# Patient Record
Sex: Male | Born: 1965 | Race: White | Hispanic: No | Marital: Married | Smoking: Never smoker
Health system: Southern US, Community
[De-identification: ages and names within clinical notes are randomized; demographics above are authoritative.]

## PROBLEM LIST (undated history)

## (undated) DIAGNOSIS — G4730 Sleep apnea, unspecified: Secondary | ICD-10-CM

## (undated) HISTORY — PX: KNEE SURGERY: SHX244

## (undated) HISTORY — PX: ADENOIDECTOMY: SUR15

## (undated) HISTORY — DX: Sleep apnea, unspecified: G47.30

---

## 1998-02-20 ENCOUNTER — Other Ambulatory Visit: Admission: RE | Admit: 1998-02-20 | Discharge: 1998-02-20 | Payer: Self-pay | Admitting: Obstetrics and Gynecology

## 2007-04-30 ENCOUNTER — Ambulatory Visit (HOSPITAL_BASED_OUTPATIENT_CLINIC_OR_DEPARTMENT_OTHER): Admission: RE | Admit: 2007-04-30 | Discharge: 2007-04-30 | Payer: Self-pay | Admitting: Family Medicine

## 2007-05-07 ENCOUNTER — Ambulatory Visit: Payer: Self-pay | Admitting: Internal Medicine

## 2010-12-11 NOTE — Procedures (Signed)
NAME:  Gregg Lloyd, Gregg Lloyd                 ACCOUNT NO.:  [id number]   MEDICAL RECORD NO.:  [id number]          PATIENT TYPE:  OUT   LOCATION:  SLEEP CENTER                 FACILITY:  [facility]   PHYSICIAN:  Clinton D. Maple Hudson, MD, FCCP, FACPDATE OF BIRTH:  1967-09-27   DATE OF STUDY:  04/30/2007                            NOCTURNAL POLYSOMNOGRAM   REFERRING PHYSICIAN:  Veatrice Bourbon, Dr.   INDICATION FOR STUDY:  Insomnia with sleep apnea.   EPWORTH SLEEPINESS SCORE:  5/24.   Height 5 feet 9 inches, weight 200 pounds.   MEDICATIONS:  There are no home medications reported.   SLEEP ARCHITECTURE:  Total sleep time 328 minutes with sleep efficiency  77%.  Stage I was 11%, stage II 57%, stage 10%, REM 22% of total sleep  time.  Sleep latency 15 minutes, REM latency 116 minutes, awake after  sleep onset 85 minutes, arousal index 32.5.  There were frequent  nonspecific wakings throughout the night and sustained increased sleep  fragmentation with EEG arousals.  No bedtime medication was taken.   RESPIRATORY DATA:  Sleep study protocol.  Apnea-hypopnea index (AHI,  RDI) 19.2 obstructive events per hour, indicating moderate obstructive  sleep apnea/hypopnea syndrome.  There were 57 obstructive events, all  hypopneas, before CPAP.  Virtually all sleep was supine and all events  were recorded in that position.  REM AHI 8.2.  CPAP was titrated to 12  CWP with optimum control demonstrated at 8 CWP, AHI 0 per hour.  A small  full-face Mirage Quattro mask was used with heated humidifier.   OXYGEN DATA:  Moderately loud snoring before CPAP with oxygen  desaturation to a nadir of 84%.  After CPAP control, saturation held 96%  on room air.   CARDIAC DATA:  Normal sinus rhythm.   MOVEMENT-PARASOMNIA:  No significant movement disturbance.  Bathroom x1.   IMPRESSIONS-RECOMMENDATIONS:  1. Sleep architecture was marked by frequent awakening and EEG arousal      which was improved once the CPAP control was  reached.  2. Moderate obstructive sleep apnea/hypopnea syndrome, apnea-hypopnea      index 19.2 per hour with all sleep and events recorded while      supine.  Moderately loud snoring with oxygen desaturation nadir      84%.  3. CPAP titration to a suggested initial trial pressure of 10      centimeters of water pressure, apnea-hypopnea index      2.7 per hour.  This gave best control over the longest observation      on review of all tracings.  A small Mirage Quattro mask was used      with heated humidifier.      Clinton D. Maple Hudson, MD, [facility], FACP  Diplomate, Biomedical engineer of Sleep Medicine  Electronically Signed     CDY/MEDQ  D:  05/07/2007 16:44:28  T:  05/08/2007 09:49:39  Job:  [id number]

## 2012-04-09 ENCOUNTER — Ambulatory Visit (INDEPENDENT_AMBULATORY_CARE_PROVIDER_SITE_OTHER): Payer: BC Managed Care – PPO | Admitting: Family Medicine

## 2012-04-09 ENCOUNTER — Encounter: Payer: Self-pay | Admitting: Family Medicine

## 2012-04-09 VITALS — BP 125/82 | HR 54 | Temp 97.3°F | Resp 16 | Ht 69.5 in | Wt 198.0 lb

## 2012-04-09 DIAGNOSIS — Z0000 Encounter for general adult medical examination without abnormal findings: Secondary | ICD-10-CM

## 2012-04-09 DIAGNOSIS — K921 Melena: Secondary | ICD-10-CM

## 2012-04-09 LAB — CBC WITH DIFFERENTIAL/PLATELET
Basophils Absolute: 0 10*3/uL (ref 0.0–0.1)
Basophils Relative: 1 % (ref 0–1)
Eosinophils Absolute: 0.1 10*3/uL (ref 0.0–0.7)
Eosinophils Relative: 2 % (ref 0–5)
HCT: 43.5 % (ref 39.0–52.0)
Hemoglobin: 15.8 g/dL (ref 13.0–17.0)
Lymphocytes Relative: 30 % (ref 12–46)
Lymphs Abs: 1.3 10*3/uL (ref 0.7–4.0)
MCH: 29.7 pg (ref 26.0–34.0)
MCHC: 36.3 g/dL — ABNORMAL HIGH (ref 30.0–36.0)
MCV: 81.8 fL (ref 78.0–100.0)
Monocytes Absolute: 0.5 10*3/uL (ref 0.1–1.0)
Monocytes Relative: 11 % (ref 3–12)
Neutro Abs: 2.4 10*3/uL (ref 1.7–7.7)
Neutrophils Relative %: 56 % (ref 43–77)
Platelets: 174 10*3/uL (ref 150–400)
RBC: 5.32 MIL/uL (ref 4.22–5.81)
RDW: 13.3 % (ref 11.5–15.5)
WBC: 4.3 10*3/uL (ref 4.0–10.5)

## 2012-04-09 LAB — POCT URINALYSIS DIPSTICK
Bilirubin, UA: NEGATIVE
Blood, UA: NEGATIVE
Glucose, UA: NEGATIVE
Ketones, UA: NEGATIVE
Leukocytes, UA: NEGATIVE
Nitrite, UA: NEGATIVE
Protein, UA: NEGATIVE
Spec Grav, UA: 1.02
Urobilinogen, UA: 0.2
pH, UA: 7

## 2012-04-09 LAB — COMPREHENSIVE METABOLIC PANEL
ALT: 17 U/L (ref 0–53)
AST: 14 U/L (ref 0–37)
Albumin: 4.7 g/dL (ref 3.5–5.2)
Alkaline Phosphatase: 52 U/L (ref 39–117)
BUN: 16 mg/dL (ref 6–23)
CO2: 28 mEq/L (ref 19–32)
Calcium: 9.5 mg/dL (ref 8.4–10.5)
Chloride: 104 mEq/L (ref 96–112)
Creat: 0.78 mg/dL (ref 0.50–1.35)
Glucose, Bld: 73 mg/dL (ref 70–99)
Potassium: 4.2 mEq/L (ref 3.5–5.3)
Sodium: 137 mEq/L (ref 135–145)
Total Bilirubin: 0.6 mg/dL (ref 0.3–1.2)
Total Protein: 6.8 g/dL (ref 6.0–8.3)

## 2012-04-09 LAB — LIPID PANEL
Cholesterol: 180 mg/dL (ref 0–200)
HDL: 51 mg/dL (ref >39)
LDL Cholesterol: 116 mg/dL — ABNORMAL HIGH (ref 0–99)
Total CHOL/HDL Ratio: 3.5 Ratio
Triglycerides: 66 mg/dL (ref <150)
VLDL: 13 mg/dL (ref 0–40)

## 2012-04-09 LAB — TESTOSTERONE: Testosterone: 523.96 ng/dL (ref 300–890)

## 2012-04-09 LAB — IFOBT (OCCULT BLOOD): IFOBT: POSITIVE

## 2012-04-09 NOTE — Progress Notes (Signed)
@[email or web address] @  Patient ID: Gregg Lloyd MRN: [id number], DOB: 20-Dec-1965 46 y.o. Date of Encounter: 04/09/2012, 1:37 PM  Primary Physician: No primary provider on file.  Chief Complaint: Physical (CPE)  HPI: 46 y.o. y/o male with history noted below here for CPE.  Doing well. No issues/complaints. 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 He works in [employer] currently.  Review of Systems: Consitutional: No fever, chills, fatigue, night sweats, lymphadenopathy, or weight changes. Eyes: No visual changes, eye redness, or discharge. ENT/Mouth: Ears: No otalgia, tinnitus, hearing loss, discharge. Nose: No congestion, rhinorrhea, sinus pain, or epistaxis. Throat: No sore throat, post nasal drip, or teeth pain. Cardiovascular: No CP, palpitations, diaphoresis, DOE, edema, orthopnea, PND. Respiratory: No cough, hemoptysis, SOB, or wheezing. Gastrointestinal: No anorexia, dysphagia, reflux, pain, nausea, vomiting, hematemesis, diarrhea, constipation, BRBPR, or melena. Genitourinary: No dysuria, frequency, urgency, hematuria, incontinence, nocturia, decreased urinary stream, discharge, impotence, or testicular pain/masses. Musculoskeletal: No decreased ROM, myalgias, stiffness, joint swelling, or weakness. Skin: No rash, erythema, lesion changes, pain, warmth, jaundice, or pruritis. Neurological: No headache, dizziness, syncope, seizures, tremors, memory loss, coordination problems, or paresthesias. Psychological: No anxiety, depression, hallucinations, SI/HI. Endocrine: No fatigue, polydipsia, polyphagia, polyuria, or known diabetes. All other systems were reviewed and are otherwise negative.  No past medical history on file.   No past surgical history on file.  Home Meds:  Prior to Admission medications   Not on File    Allergies: No Known Allergies  History   Social History  . Marital Status: Married    Spouse Name: N/A    Number of  Children: N/A  . Years of Education: N/A   Occupational History  . Not on file.   Social History Main Topics  . Smoking status: Never Smoker   . Smokeless tobacco: Not on file  . Alcohol Use: Not on file  . Drug Use: Not on file  . Sexually Active: Not on file   Other Topics Concern  . Not on file   Social History Narrative  . No narrative on file    No family history on file.  Physical Exam: Blood pressure 125/82, pulse 54, temperature 97.3 F (36.3 C), resp. rate 16, height 5' 9.5" (1.765 m), weight 198 lb (89.812 kg).  General: Well developed, well nourished, in no acute distress. HEENT: Normocephalic, atraumatic. Conjunctiva pink, sclera non-icteric. Pupils 2 mm constricting to 1 mm, round, regular, and equally reactive to light and accomodation. EOMI. Internal auditory canal clear. TMs with good cone of light and without pathology. Nasal mucosa pink. Nares are without discharge. No sinus tenderness. Oral mucosa pink. Dentition good condition. Pharynx without exudate.   Neck: Supple. Trachea midline. No thyromegaly. Full ROM. No lymphadenopathy. Lungs: Clear to auscultation bilaterally without wheezes, rales, or rhonchi. Breathing is of normal effort and unlabored. Cardiovascular: RRR with S1 S2. No murmurs, rubs, or gallops appreciated. Distal pulses 2+ symmetrically. No carotid or abdominal bruits Abdomen: Soft, non-tender, non-distended with normoactive bowel sounds. No hepatosplenomegaly or masses. No rebound/guarding. No CVA tenderness. Without hernias.  Rectal: No external hemorrhoids or fissures. Rectal vault without masses.  Genitourinary:  circumcised male. No penile lesions. Testes descended bilaterally, and smooth without tenderness or masses.  Musculoskeletal: Full range of motion and 5/5 strength throughout. Without swelling, atrophy, tenderness, crepitus, or warmth. Extremities without clubbing, cyanosis, or edema. Calves supple. Skin: Warm and moist without  erythema, ecchymosis, wounds, or rash. Neuro: A+Ox3. CN II-XII grossly intact. Moves all extremities  spontaneously. Full sensation throughout. Normal gait. DTR 2+ throughout upper and lower extremities. Finger to nose intact. Psych:  Responds to questions appropriately with a normal affect.   Results for orders placed in visit on 04/09/12  POCT URINALYSIS DIPSTICK      Component Value Range   Color, UA yellwo     Clarity, UA clear     Glucose, UA neg     Bilirubin, UA neg     Ketones, UA neg     Spec Grav, UA 1.020     Blood, UA neg     pH, UA 7.0     Protein, UA neg     Urobilinogen, UA 0.2     Nitrite, UA neg     Leukocytes, UA Negative    IFOBT (OCCULT BLOOD)      Component Value Range   IFOBT Positive      Assessment/Plan:  46 y.o. y/o active male here for CPE.  I don't have an explanation for the positive FOBT, so he will need further eval for this.  1. Routine general medical examination at a health care facility  CBC with Differential, Comprehensive metabolic panel, Lipid panel, Testosterone, POCT urinalysis dipstick, IFOBT POC (occult bld, rslt in office)    Signed, Elvina Sidle, MD 04/09/2012 1:37 PM

## 2012-04-10 ENCOUNTER — Encounter: Payer: Self-pay | Admitting: Gastroenterology

## 2012-04-23 ENCOUNTER — Telehealth: Payer: Self-pay

## 2012-04-23 NOTE — Telephone Encounter (Signed)
He had blood in his stool/ needs GI eval. I called patient back and he is advised

## 2012-04-23 NOTE — Telephone Encounter (Signed)
Pt just called stating he got his referral information in the mail and does not understand why he is needing to go to a gastroenterologist   Please call [phone]  Thanks  Lupita Leash

## 2012-05-07 ENCOUNTER — Encounter: Payer: Self-pay | Admitting: Gastroenterology

## 2012-05-07 ENCOUNTER — Ambulatory Visit (INDEPENDENT_AMBULATORY_CARE_PROVIDER_SITE_OTHER): Payer: BC Managed Care – PPO | Admitting: Gastroenterology

## 2012-05-07 VITALS — BP 118/64 | HR 58 | Ht 69.5 in | Wt 200.0 lb

## 2012-05-07 DIAGNOSIS — R195 Other fecal abnormalities: Secondary | ICD-10-CM

## 2012-05-07 MED ORDER — PEG-KCL-NACL-NASULF-NA ASC-C 100 G PO SOLR: 1.0000 | Freq: Once | ORAL | Status: DC

## 2012-05-07 NOTE — Patient Instructions (Addendum)
You have been scheduled for a colonoscopy with propofol. Please follow written instructions given to you at your visit today.  Please pick up your prep kit at the pharmacy within the next 1-3 days. If you use inhalers (even only as needed), please bring them with you on the day of your procedure.  cc: Elvina Sidle, MD

## 2012-05-07 NOTE — Progress Notes (Signed)
History of Present Illness: This is a 46 year old male who was recently found to have Hemoccult positive stool on physical exam. He notes occasionally seeing small amounts of blood when wiping after a bowel movement. He has no other gastrointestinal complaints. Denies weight loss, abdominal pain, constipation, diarrhea, change in stool caliber, melena, nausea, vomiting, dysphagia, reflux symptoms, chest pain.  Review of Systems: Pertinent positive and negative review of systems were noted in the above HPI section. All other review of systems were otherwise negative.  Current Medications, Allergies, Past Medical History, Past Surgical History, Family History and Social History were reviewed in [employer] record.  Physical Exam: General: Well developed , well nourished, no acute distress Head: Normocephalic and atraumatic Eyes:  sclerae anicteric, EOMI Ears: Normal auditory acuity Mouth: No deformity or lesions Neck: Supple, no masses or thyromegaly Lungs: Clear throughout to auscultation Heart: Regular rate and rhythm; no murmurs, rubs or bruits Abdomen: Soft, non tender and non distended. No masses, hepatosplenomegaly or hernias noted. Normal Bowel sounds Rectal: deferred to colonoscopy Musculoskeletal: Symmetrical with no gross deformities  Skin: No lesions on visible extremities Pulses:  Normal pulses noted Extremities: No clubbing, cyanosis, edema or deformities noted Neurological: Alert oriented x 4, grossly nonfocal Cervical Nodes:  No significant cervical adenopathy Inguinal Nodes: No significant inguinal adenopathy Psychological:  Alert and cooperative. Normal mood and affect  Assessment and Recommendations:  1. Hemoccult positive stool, small-volume bleeding with wiping after bowel movements. His symptoms are likely due to benign anorectal disease such as hemorrhoids. Rule out colorectal neoplasms. Schedule colonoscopy. The risks, benefits, and alternatives  to colonoscopy with possible biopsy and possible polypectomy were discussed with the patient and they consent to proceed.

## 2012-07-03 ENCOUNTER — Encounter: Payer: BC Managed Care – PPO | Admitting: Gastroenterology

## 2012-08-07 ENCOUNTER — Ambulatory Visit (AMBULATORY_SURGERY_CENTER): Payer: BC Managed Care – PPO | Admitting: Gastroenterology

## 2012-08-07 ENCOUNTER — Encounter: Payer: Self-pay | Admitting: Gastroenterology

## 2012-08-07 VITALS — BP 116/71 | HR 47 | Temp 96.6°F | Resp 20 | Ht 69.5 in | Wt 200.0 lb

## 2012-08-07 DIAGNOSIS — K921 Melena: Secondary | ICD-10-CM

## 2012-08-07 DIAGNOSIS — R195 Other fecal abnormalities: Secondary | ICD-10-CM

## 2012-08-07 MED ORDER — SODIUM CHLORIDE 0.9 % IV SOLN: 500.0000 mL | INTRAVENOUS | Status: DC

## 2012-08-07 NOTE — Progress Notes (Signed)
Patient did not experience any of the following events: a burn prior to discharge; a fall within the facility; wrong site/side/patient/procedure/implant event; or a hospital transfer or hospital admission upon discharge from the facility. (G8907) Patient did not have preoperative order for IV antibiotic SSI prophylaxis. (G8918)  

## 2012-08-07 NOTE — Patient Instructions (Addendum)
YOU HAD AN ENDOSCOPIC PROCEDURE TODAY AT THE [facility]: Refer to the procedure report that was given to you for any specific questions about what was found during the examination.  If the procedure report does not answer your questions, please call your gastroenterologist to clarify.  If you requested that your care partner not be given the details of your procedure findings, then the procedure report has been included in a sealed envelope for you to review at your convenience later.  YOU SHOULD EXPECT: Some feelings of bloating in the abdomen. Passage of more gas than usual.  Walking can help get rid of the air that was put into your GI tract during the procedure and reduce the bloating. If you had a lower endoscopy (such as a colonoscopy or flexible sigmoidoscopy) you may notice spotting of blood in your stool or on the toilet paper. If you underwent a bowel prep for your procedure, then you may not have a normal bowel movement for a few days.  DIET: Your first meal following the procedure should be a light meal and then it is ok to progress to your normal diet.  A half-sandwich or bowl of soup is an example of a good first meal.  Heavy or fried foods are harder to digest and may make you feel nauseous or bloated.  Likewise meals heavy in dairy and vegetables can cause extra gas to form and this can also increase the bloating.  Drink plenty of fluids but you should avoid alcoholic beverages for 24 hours.  ACTIVITY: Your care partner should take you home directly after the procedure.  You should plan to take it easy, moving slowly for the rest of the day.  You can resume normal activity the day after the procedure however you should NOT DRIVE or use heavy machinery for 24 hours (because of the sedation medicines used during the test).    SYMPTOMS TO REPORT IMMEDIATELY: A gastroenterologist can be reached at any hour.  During normal business hours, 8:30 AM to 5:00 PM Monday through Friday,  call [phone].  After hours and on weekends, please call the GI answering service at [phone] who will take a message and have the physician on call contact you.   Following lower endoscopy (colonoscopy or flexible sigmoidoscopy):  Excessive amounts of blood in the stool  Significant tenderness or worsening of abdominal pains  Swelling of the abdomen that is new, acute  Fever of 100F or higher   FOLLOW UP: If any biopsies were taken you will be contacted by phone or by letter within the next 1-3 weeks.  Call your gastroenterologist if you have not heard about the biopsies in 3 weeks.  Our staff will call the home number listed on your records the next business day following your procedure to check on you and address any questions or concerns that you may have at that time regarding the information given to you following your procedure. This is a courtesy call and so if there is no answer at the home number and we have not heard from you through the emergency physician on call, we will assume that you have returned to your regular daily activities without incident.  SIGNATURES/CONFIDENTIALITY: You and/or your care partner have signed paperwork which will be entered into your electronic medical record.  These signatures attest to the fact that that the information above on your After Visit Summary has been reviewed and is understood.  Full responsibility of the confidentiality of   of this discharge information lies with you and/or your care-partner.   Handout on hemorrhoids Repeat colon in 10 years 2024.

## 2012-08-07 NOTE — Op Note (Signed)
[facility] [address] [place], [address]   COLONOSCOPY PROCEDURE REPORT  PATIENT: Gregg, Lloyd  MR#: [id number] BIRTHDATE: 19-Mar-1966 , 46  yrs. old GENDER: Male ENDOSCOPIST: Meryl Dare, MD, [facility] REFERRED XB:JYNW Lauenstein, M.D. PROCEDURE DATE:  08/07/2012 PROCEDURE:   Colonoscopy, diagnostic ASA CLASS:   Class II INDICATIONS:hematochezia and heme-positive stool. MEDICATIONS: MAC sedation, administered by CRNA and propofol (Diprivan) 260mg  IV DESCRIPTION OF PROCEDURE:   After the risks benefits and alternatives of the procedure were thoroughly explained, informed consent was obtained.  A digital rectal exam revealed no abnormalities of the rectum.   The LB [id number]  endoscope was introduced through the anus and advanced to the cecum, which was identified by both the appendix and ileocecal valve. No adverse events experienced.   The quality of the prep was good, using MoviPrep  The instrument was then slowly withdrawn as the colon was fully examined.  COLON FINDINGS: A normal appearing cecum, ileocecal valve, and appendiceal orifice were identified.  The ascending, hepatic flexure, transverse, splenic flexure, descending, sigmoid colon and rectum appeared unremarkable.  No polyps or cancers were seen. Retroflexed views revealed small internal hemorrhoids. The time to cecum=1 minutes 26 seconds.  Withdrawal time=8 minutes 15 seconds. The scope was withdrawn and the procedure completed.  COMPLICATIONS: There were no complications.  ENDOSCOPIC IMPRESSION: 1.  Normal colon 2.  Small internal hemorrhoids  RECOMMENDATIONS: 1.  Continue current colorectal screening recommendations for "routine risk" patients with a repeat colonoscopy in 10 years.   eSigned:  Meryl Dare, MD, [facility] 08/07/2012 8:50 AM

## 2012-08-10 ENCOUNTER — Telehealth: Payer: Self-pay | Admitting: *Deleted

## 2012-08-10 NOTE — Telephone Encounter (Signed)
  Follow up Call-  Call back number 08/07/2012  Post procedure Call Back phone  # [phone]  Permission to leave phone message Yes     Patient questions:  Do you have a fever, pain , or abdominal swelling? no Pain Score  0 *  Have you tolerated food without any problems? yes  Have you been able to return to your normal activities? yes  Do you have any questions about your discharge instructions: Diet   no Medications  no Follow up visit  no  Do you have questions or concerns about your Care? no  Actions: * If pain score is 4 or above: No action needed, pain <4.

## 2013-11-04 ENCOUNTER — Encounter: Payer: Self-pay | Admitting: Family Medicine

## 2013-11-04 ENCOUNTER — Ambulatory Visit (INDEPENDENT_AMBULATORY_CARE_PROVIDER_SITE_OTHER): Payer: BC Managed Care – PPO | Admitting: Family Medicine

## 2013-11-04 VITALS — BP 108/78 | HR 50 | Temp 97.7°F | Resp 16 | Ht 70.0 in | Wt 191.2 lb

## 2013-11-04 DIAGNOSIS — N529 Male erectile dysfunction, unspecified: Secondary | ICD-10-CM

## 2013-11-04 DIAGNOSIS — Z0000 Encounter for general adult medical examination without abnormal findings: Secondary | ICD-10-CM

## 2013-11-04 DIAGNOSIS — I781 Nevus, non-neoplastic: Secondary | ICD-10-CM

## 2013-11-04 LAB — POCT URINALYSIS DIPSTICK
Bilirubin, UA: NEGATIVE
Blood, UA: NEGATIVE
Glucose, UA: NEGATIVE
Ketones, UA: NEGATIVE
Leukocytes, UA: NEGATIVE
Nitrite, UA: NEGATIVE
Protein, UA: NEGATIVE
Spec Grav, UA: 1.02
Urobilinogen, UA: 0.2
pH, UA: 6.5

## 2013-11-04 LAB — CBC
HCT: 45.2 % (ref 39.0–52.0)
Hemoglobin: 16 g/dL (ref 13.0–17.0)
MCH: 29.8 pg (ref 26.0–34.0)
MCHC: 35.4 g/dL (ref 30.0–36.0)
MCV: 84.2 fL (ref 78.0–100.0)
Platelets: 188 10*3/uL (ref 150–400)
RBC: 5.37 MIL/uL (ref 4.22–5.81)
RDW: 13.1 % (ref 11.5–15.5)
WBC: 3.5 10*3/uL — ABNORMAL LOW (ref 4.0–10.5)

## 2013-11-04 LAB — COMPREHENSIVE METABOLIC PANEL
ALT: 20 U/L (ref 0–53)
AST: 15 U/L (ref 0–37)
Albumin: 4.7 g/dL (ref 3.5–5.2)
Alkaline Phosphatase: 55 U/L (ref 39–117)
BUN: 12 mg/dL (ref 6–23)
CO2: 28 mEq/L (ref 19–32)
Calcium: 9.7 mg/dL (ref 8.4–10.5)
Chloride: 103 mEq/L (ref 96–112)
Creat: 0.8 mg/dL (ref 0.50–1.35)
Glucose, Bld: 93 mg/dL (ref 70–99)
Potassium: 4.6 mEq/L (ref 3.5–5.3)
Sodium: 137 mEq/L (ref 135–145)
Total Bilirubin: 0.8 mg/dL (ref 0.2–1.2)
Total Protein: 7 g/dL (ref 6.0–8.3)

## 2013-11-04 LAB — LIPID PANEL
Cholesterol: 201 mg/dL — ABNORMAL HIGH (ref 0–200)
HDL: 66 mg/dL (ref >39)
LDL Cholesterol: 119 mg/dL — ABNORMAL HIGH (ref 0–99)
Total CHOL/HDL Ratio: 3 Ratio
Triglycerides: 78 mg/dL (ref <150)
VLDL: 16 mg/dL (ref 0–40)

## 2013-11-04 LAB — IFOBT (OCCULT BLOOD): IFOBT: POSITIVE

## 2013-11-04 MED ORDER — TADALAFIL 5 MG PO TABS: 5.0000 mg | ORAL_TABLET | Freq: Every day | ORAL | Status: DC | PRN

## 2013-11-04 NOTE — Progress Notes (Signed)
Patient ID: Gregg Lloyd MRN: [id number], DOB: [birth year] 48 y.o. Date of Encounter: 11/04/2013, 8:39 AM  Primary Physician: Elvina Sidle, MD  Chief Complaint: Physical (CPE)  HPI: 48 y.o. y/o male with history noted below here for CPE.  Doing well. No issues/complaints.  Notes some erectile dysfunction and a couple nevi he would like checked. Former Advice worker.  Married, two step children.  Daughter is having thyroid cancer today.  Currently executive in insurance.  Continues to play soccer and run 5 days a week  Review of Systems: Consitutional: No fever, chills, fatigue, night sweats, lymphadenopathy, or weight changes. Eyes: No visual changes, eye redness, or discharge. ENT/Mouth: Ears: No otalgia, tinnitus, hearing loss, discharge. Nose: No congestion, rhinorrhea, sinus pain, or epistaxis. Throat: No sore throat, post nasal drip, or teeth pain. Cardiovascular: No CP, palpitations, diaphoresis, DOE, edema, orthopnea, PND. Respiratory: No cough, hemoptysis, SOB, or wheezing. Gastrointestinal: No anorexia, dysphagia, reflux, pain, nausea, vomiting, hematemesis, diarrhea, constipation, BRBPR, or melena. Genitourinary: No dysuria, frequency, urgency, hematuria, incontinence, nocturia, decreased urinary stream, discharge, impotence, or testicular pain/masses. Musculoskeletal: No decreased ROM, myalgias, stiffness, joint swelling, or weakness. Skin: No rash, erythema, lesion changes, pain, warmth, jaundice, or pruritis. Neurological: No headache, dizziness, syncope, seizures, tremors, memory loss, coordination problems, or paresthesias. Psychological: No anxiety, depression, hallucinations, SI/HI. Endocrine: No fatigue, polydipsia, polyphagia, polyuria, or known diabetes. All other systems were reviewed and are otherwise negative.  Past Medical History  Diagnosis Date  . Sleep apnea     CPAP Machine      Past Surgical History  Procedure Laterality Date  . Knee surgery       Home Meds:  Prior to Admission medications   Medication Sig Start Date End Date Taking? Authorizing Provider  AMBULATORY NON FORMULARY MEDICATION CPAP Machine  As directed    Historical Provider, MD  tadalafil (CIALIS) 5 MG tablet Take 1 tablet (5 mg total) by mouth daily as needed for erectile dysfunction. 11/04/13   Elvina Sidle, MD    Allergies: No Known Allergies  History   Social History  . Marital Status: Married    Spouse Name: N/A    Number of Children: N/A  . Years of Education: N/A   Occupational History  . Not on file.   Social History Main Topics  . Smoking status: Never Smoker   . Smokeless tobacco: Never Used  . Alcohol Use: Yes     Comment: 2 daily   . Drug Use: No  . Sexual Activity: Not on file   Other Topics Concern  . Not on file   Social History Narrative   Daily caffeine     Family History  Problem Relation Age of Onset  . Colon cancer Neg Hx     Physical Exam: Blood pressure 108/78, pulse 50, temperature 97.7 F (36.5 C), temperature source Oral, resp. rate 16, height 5\' 10"  (1.778 m), weight 191 lb 3.2 oz (86.728 kg), SpO2 97.00%.  General: Well developed, well nourished, in no acute distress. HEENT: Normocephalic, atraumatic. Conjunctiva pink, sclera non-icteric. Pupils 2 mm constricting to 1 mm, round, regular, and equally reactive to light and accomodation. EOMI. Internal auditory canal clear. TMs with good cone of light and without pathology. Nasal mucosa pink. Nares are without discharge. No sinus tenderness. Oral mucosa pink. Dentition excellent. Pharynx without exudate.   Neck: Supple. Trachea midline. No thyromegaly. Full ROM. No lymphadenopathy. Lungs: Clear to auscultation bilaterally without wheezes, rales, or rhonchi. Breathing is of normal effort and unlabored. Cardiovascular:  RRR with S1 S2. No murmurs, rubs, or gallops appreciated. Distal pulses 2+ symmetrically. No carotid or abdominal bruits Abdomen: Soft, non-tender,  non-distended with normoactive bowel sounds. No hepatosplenomegaly or masses. No rebound/guarding. No CVA tenderness. Without hernias.  Rectal: No external hemorrhoids or fissures. Rectal vault without masses.  Genitourinary:  circumcised male. No penile lesions. Testes descended bilaterally, and smooth without tenderness or masses.  Musculoskeletal: Full range of motion and 5/5 strength throughout. Without swelling, atrophy, tenderness, crepitus, or warmth. Extremities without clubbing, cyanosis, or edema. Calves supple. Skin: Warm and moist without erythema, ecchymosis, wounds, or rash. Neuro: A+Ox3. CN II-XII grossly intact. Moves all extremities spontaneously. Full sensation throughout. Normal gait. DTR 2+ throughout upper and lower extremities. Finger to nose intact. Psych:  Responds to questions appropriately with a normal affect.   Studies: CBC, CMET, Lipid, PSA UA:  Results for orders placed in visit on 11/04/13  POCT URINALYSIS DIPSTICK      Result Value Ref Range   Color, UA yellow     Clarity, UA clear     Glucose, UA neg     Bilirubin, UA neg     Ketones, UA neg     Spec Grav, UA 1.020     Blood, UA neg     pH, UA 6.5     Protein, UA neg     Urobilinogen, UA 0.2     Nitrite, UA neg     Leukocytes, UA Negative    IFOBT (OCCULT BLOOD)      Result Value Ref Range   IFOBT Positive       Assessment/Plan:  48 y.o. y/o  male here for CPE Annual physical exam - Plan: CBC, Comprehensive metabolic panel, Lipid panel, PSA, POCT urinalysis dipstick  Nevus, non-neoplastic - Plan: Ambulatory referral to Dermatology  Erectile dysfunction - Plan: tadalafil (CIALIS) 5 MG tablet   -  Signed, Elvina SidleKurt Tomasa Dobransky, MD 11/04/2013 8:39 AM

## 2013-11-05 LAB — PSA: PSA: 0.7 ng/mL (ref <=4.00)

## 2013-11-08 ENCOUNTER — Telehealth: Payer: Self-pay

## 2013-11-08 NOTE — Telephone Encounter (Signed)
I gave him a coupon that would give him 30 Cialis 5 mg tabs.  What happened?

## 2013-11-08 NOTE — Telephone Encounter (Signed)
Ins does not cover daily use of cialis for ED. Called pharm to see if lower # per month is covered. Advised that pt p/up #30 free w/coupon card, but that pt would like to try the generic for Viagra which can be purchased very inexpensively OOP when the month is up. Dr L, do you want to auth Rx for this? Pended to put on hold until needed.

## 2013-11-09 ENCOUNTER — Other Ambulatory Visit: Payer: Self-pay | Admitting: Family Medicine

## 2013-11-09 DIAGNOSIS — N529 Male erectile dysfunction, unspecified: Secondary | ICD-10-CM

## 2013-11-09 MED ORDER — SILDENAFIL CITRATE 100 MG PO TABS: 50.0000 mg | ORAL_TABLET | Freq: Every day | ORAL | Status: DC | PRN

## 2013-11-09 NOTE — Telephone Encounter (Signed)
Pt will call back at the end of the month for the generic Viagra- he is using Cialis 5mg  that he got with the coupon. He states it is very expensive after he is finished with this free month.   Can we send in a script for the generic Viagra?

## 2013-11-17 NOTE — Telephone Encounter (Signed)
Rx sent 

## 2014-01-18 ENCOUNTER — Encounter: Payer: Self-pay | Admitting: Family Medicine

## 2014-08-03 ENCOUNTER — Ambulatory Visit (INDEPENDENT_AMBULATORY_CARE_PROVIDER_SITE_OTHER): Payer: BLUE CROSS/BLUE SHIELD

## 2014-08-03 ENCOUNTER — Ambulatory Visit (INDEPENDENT_AMBULATORY_CARE_PROVIDER_SITE_OTHER): Payer: BLUE CROSS/BLUE SHIELD | Admitting: Family Medicine

## 2014-08-03 VITALS — BP 134/82 | HR 70 | Temp 97.6°F | Resp 16

## 2014-08-03 DIAGNOSIS — R079 Chest pain, unspecified: Secondary | ICD-10-CM

## 2014-08-03 DIAGNOSIS — J9811 Atelectasis: Secondary | ICD-10-CM

## 2014-08-03 DIAGNOSIS — R0602 Shortness of breath: Secondary | ICD-10-CM

## 2014-08-03 DIAGNOSIS — K219 Gastro-esophageal reflux disease without esophagitis: Secondary | ICD-10-CM

## 2014-08-03 LAB — COMPREHENSIVE METABOLIC PANEL
ALT: 20 U/L (ref 0–53)
AST: 14 U/L (ref 0–37)
Albumin: 4.7 g/dL (ref 3.5–5.2)
Alkaline Phosphatase: 51 U/L (ref 39–117)
BILIRUBIN TOTAL: 0.8 mg/dL (ref 0.2–1.2)
BUN: 13 mg/dL (ref 6–23)
CO2: 26 meq/L (ref 19–32)
Calcium: 9.7 mg/dL (ref 8.4–10.5)
Chloride: 101 mEq/L (ref 96–112)
Creat: 0.71 mg/dL (ref 0.50–1.35)
Glucose, Bld: 98 mg/dL (ref 70–99)
Potassium: 4.2 mEq/L (ref 3.5–5.3)
SODIUM: 136 meq/L (ref 135–145)
TOTAL PROTEIN: 7.2 g/dL (ref 6.0–8.3)

## 2014-08-03 LAB — TSH: TSH: 1.264 u[IU]/mL (ref 0.350–4.500)

## 2014-08-03 LAB — TROPONIN I: Troponin I: <0.01 ng/mL (ref <0.06)

## 2014-08-03 MED ORDER — DOXYCYCLINE HYCLATE 100 MG PO CAPS: 100.0000 mg | ORAL_CAPSULE | Freq: Two times a day (BID) | ORAL | Status: DC

## 2014-08-03 MED ORDER — GI COCKTAIL ~~LOC~~
30.0000 mL | Freq: Once | ORAL | Status: AC
  Administered 2014-08-03: 30 mL via ORAL

## 2014-08-03 MED ORDER — SUCRALFATE 1 G PO TABS: 1.0000 g | ORAL_TABLET | Freq: Three times a day (TID) | ORAL | Status: DC

## 2014-08-03 NOTE — Patient Instructions (Signed)
I will give you a call later on today with your troponin test.  If this is elevated we will need you to go to the ER We are going to treat you with doxycycline for any developing chest infection. Take this twice a day with plenty of water and some food It seems that your chest pain and "burning" are likely due to GERD. We are going to treat you with carafate before meals and before bed for about 10 days.  Also purchase some OTC prilosec and take it once a day for 2 weeks or so  If your symptoms worsen or come back go to the ER for further evaluation I will set you up with cardiology for an out-patient follow-up appointment.  They may recommend that you have a stress test of some sort  Please see me in 10- 14 days to repeat your chest x-ray

## 2014-08-03 NOTE — Progress Notes (Signed)
[facility] [address], [place] [id number] [phone]  Date:  08/03/2014   Name:  Gregg GunningJason Pollett   DOB:  11/11/1965   MRN:  [id number]  PCP:  Elvina SidleLAUENSTEIN,KURT, MD    Chief Complaint: No chief complaint on file.   History of Present Illness:  Gregg Lloyd is a 49 y.o. very pleasant male patient who presents with the following:  Here today with concern regarding chest pains  Today is Wednesday.  On Saturday he noted onset of heart racing, minimal CP and feeling SOB.  He also noted some nausea but no vomiting. Admits he got a little anxious about this, "I probably made my symptoms worse." He had just finished lunch- was not sure if his sx had something to do with eating.  Sx lasted about 45 minutes.  The same sort of sx recurred a few times over Sunday/ Monday.  He felt a little lightheaded at work on Monday. Yesterday he had a lunch meeting and felt bad driving there- however he felt better after eating lunch.  Last night he noted mild stomach discomfort and "burning," mild HA.  Felt ok this am but then his sx came back so he decided to come in.    His father died of a stroke- he was 47 years old.  He is not aware of any history of heart trouble He is a non- smoker.  He does exercise most days- never has any chest pain with exertion. Former Building surveyorprofessional athlete   He did have an echo about 8 years ago which was normal.  Otherwise he is not a cardiology pt  He does have a "little feeling of indigestion" right now.   There are no active problems to display for this patient.   Past Medical History  Diagnosis Date  . Sleep apnea     CPAP Machine     Past Surgical History  Procedure Laterality Date  . Knee surgery      History  Substance Use Topics  . Smoking status: Never Smoker   . Smokeless tobacco: Never Used  . Alcohol Use: Yes     Comment: 2 daily     Family History  Problem Relation Age of Onset  . Colon cancer Neg Hx     No Known  Allergies  Medication list has been reviewed and updated.  Current Outpatient Prescriptions on File Prior to Visit  Medication Sig Dispense Refill  . AMBULATORY NON FORMULARY MEDICATION CPAP Machine  As directed    . sildenafil (VIAGRA) 100 MG tablet Take 0.5-1 tablets (50-100 mg total) by mouth daily as needed for erectile dysfunction. 5 tablet 11  . tadalafil (CIALIS) 5 MG tablet Take 1 tablet (5 mg total) by mouth daily as needed for erectile dysfunction. 30 tablet 4   No current facility-administered medications on file prior to visit.    Review of Systems:  As per HPI- otherwise negative.  Physical Examination: Filed Vitals:   08/03/14 1150  BP: 136/88  Pulse: 89  Resp: 16   There were no vitals filed for this visit. There is no weight on file to calculate BMI. Ideal Body Weight:    GEN: WDWN, NAD, Non-toxic, A & O x 3, looks well and fit HEENT: Atraumatic, Normocephalic. Neck supple. No masses, No LAD.  Bilateral TM wnl, oropharynx normal.  PEERL,EOMI.   Ears and Nose: No external deformity. CV: RRR, No M/G/R. No JVD. No thrill. No extra heart sounds. PULM: CTA B,  no wheezes, crackles, rhonchi. No retractions. No resp. distress. No accessory muscle use. ABD: S, NT, ND, +BS. No rebound. No HSM.  Benign exam EXTR: No c/c/e NEURO Normal gait.  PSYCH: Normally interactive. Conversant. Not depressed or anxious appearing.  Calm demeanor.   Given a GI cocktail which did help- his discomfort was "90% better" per his report about 15 minutes after medication  EKG:  NSR, no concerning ST changes  [facility] reading (PRIMARY) by  Dr. Patsy Lager. CXR: slight increased density RLL- may be normal variant CHEST 2 VIEW  COMPARISON: None.  FINDINGS: The lungs are adequately inflated. There are coarse infrahilar lung markings on the right. The heart and pulmonary vascularity are normal. The trachea is midline. There is no pleural effusion or pneumothorax. The bony thorax is  unremarkable.  IMPRESSION: Coarse infrahilar lung markings on the right may reflect subsegmental atelectasis or early infiltrate. There are no previous studies with which to compare. Followup imaging in 10 days to 2 weeks following any therapy are recommended  Results for orders placed or performed in visit on 08/03/14  Troponin I  Result Value Ref Range   Troponin I <0.01 <0.06 ng/mL    Assessment and Plan: Chest pain, unspecified chest pain type - Plan: EKG 12-Lead, gi cocktail (Maalox,Lidocaine,Donnatal), DG Chest 2 View, Troponin I  SOB (shortness of breath) - Plan: CBC, Comprehensive metabolic panel, TSH  Atelectasis of right lung - Plan: doxycycline (VIBRAMYCIN) 100 MG capsule  Gastroesophageal reflux disease without esophagitis - Plan: sucralfate (CARAFATE) 1 G tablet  Discussed in detail with pt.  It is possible that his CP could indicate ACS or other more serious issue.  Offered to facilitate ED evaluation for further testing.  He feels comfortable that his sx are coming from GERD as he got good relief from GI cocktail and declines further evaluation at this time.  However we will do a troponin and refer him to cardiology for an outpt evaluation.    Start carafate and OTC prilosec for presumed GERD.  CXR showed atelectasis or early infiltrate as above.  Will treat with doxycycline and plan repeat CXR in 10- 14 days.   Called around 5:30 pm with troponin.  He reports that he continues to feel well and is relieved.    See patient instructions for more details.      Signed Abbe Amsterdam, MD

## 2014-08-04 ENCOUNTER — Encounter: Payer: Self-pay | Admitting: Family Medicine

## 2014-08-04 LAB — CBC
HEMATOCRIT: 45.9 % (ref 39.0–52.0)
HEMOGLOBIN: 16.5 g/dL (ref 13.0–17.0)
MCH: 30.6 pg (ref 26.0–34.0)
MCHC: 35.9 g/dL (ref 30.0–36.0)
MCV: 85 fL (ref 78.0–100.0)
MPV: 10.7 fL (ref 8.6–12.4)
PLATELETS: 209 10*3/uL (ref 150–400)
RBC: 5.4 MIL/uL (ref 4.22–5.81)
RDW: 13.7 % (ref 11.5–15.5)
WBC: 5.2 10*3/uL (ref 4.0–10.5)

## 2014-09-29 ENCOUNTER — Telehealth: Payer: Self-pay

## 2014-09-29 ENCOUNTER — Encounter: Payer: Self-pay | Admitting: Family Medicine

## 2014-09-29 ENCOUNTER — Ambulatory Visit (INDEPENDENT_AMBULATORY_CARE_PROVIDER_SITE_OTHER): Payer: BLUE CROSS/BLUE SHIELD | Admitting: Family Medicine

## 2014-09-29 VITALS — BP 132/76 | HR 69 | Temp 98.0°F | Resp 16 | Ht 69.5 in | Wt 193.0 lb

## 2014-09-29 DIAGNOSIS — N5203 Combined arterial insufficiency and corporo-venous occlusive erectile dysfunction: Secondary | ICD-10-CM | POA: Diagnosis not present

## 2014-09-29 DIAGNOSIS — R21 Rash and other nonspecific skin eruption: Secondary | ICD-10-CM

## 2014-09-29 MED ORDER — PERMETHRIN 5 % EX CREA: 1.0000 "application " | TOPICAL_CREAM | Freq: Once | CUTANEOUS | Status: DC

## 2014-09-29 MED ORDER — SILDENAFIL CITRATE 25 MG PO TABS: 25.0000 mg | ORAL_TABLET | Freq: Every day | ORAL | Status: DC | PRN

## 2014-09-29 MED ORDER — PREDNISONE 20 MG PO TABS: 40.0000 mg | ORAL_TABLET | Freq: Every day | ORAL | Status: DC

## 2014-09-29 MED ORDER — SILDENAFIL CITRATE 20 MG PO TABS
ORAL_TABLET | ORAL | Status: DC
Start: 2014-09-29 — End: 2018-02-25

## 2014-09-29 NOTE — Telephone Encounter (Signed)
Pharm called to ask if he can change the sildenafil to 20 mg instead of the 25 sent in bc it is generic and much cheaper. The way sig is written pt can take up to 100 mg daily as needed, and pt can do this with either the 20 or 25 mg. I gave Ok to change strength, but leave sig the same. Pharm agreed.

## 2014-09-29 NOTE — Progress Notes (Signed)
Subjective:  This chart was scribed for Elvina SidleKurt Lauenstein, MD by Elveria Risingimelie Horne, Medial Scribe. This patient was seen in room 27 and the patient's care was started at 12:47 PM.    Patient ID: Gregg Lloyd, male    DOB: 01/14/1966, 49 y.o.   MRN: [id number]  Chief Complaint  Patient presents with   Rash    leg    HPI HPI Comments: Gregg GunningJason Finck is a 49 y.o. male who [facility] requesting follow up for a reoccurring rash. Patient reports preforming yard work: clearing fraser fir trees. Patient reports going to another office and the doctor diagnosed him with poison oak and scabies in late fall of 2015. Patient was given a steroid injection in the office and discharged with steroid cream and Medrol. After treatment patient reports resolution but states that the rash has redeveloped to his right lower leg. Patient shares that the rash is pruriticand usually appears to both the posterior and anterior aspects of his left leg. Patient states that contact with his dog could be the cause of the reoccurrence.   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 Patient continues to play soccer and run 5 miles a day. Patient shares that his [place] up outside of [place]. Patient states that he will soon travel to [place].   There are no active problems to display for this patient.  Past Medical History  Diagnosis Date   Sleep apnea     CPAP Machine    Past Surgical History  Procedure Laterality Date   Knee surgery     No Known Allergies Prior to Admission medications   Medication Sig Start Date End Date Taking? Authorizing Provider  AMBULATORY NON FORMULARY MEDICATION CPAP Machine  As directed    Historical Provider, MD  doxycycline (VIBRAMYCIN) 100 MG capsule Take 1 capsule (100 mg total) by mouth 2 (two) times daily. 08/03/14   Pearline CablesJessica C Copland, MD  sildenafil (VIAGRA) 100 MG tablet Take 0.5-1 tablets (50-100 mg total) by mouth daily as  needed for erectile dysfunction. 11/09/13   Elvina SidleKurt Lauenstein, MD  sucralfate (CARAFATE) 1 G tablet Take 1 tablet (1 g total) by mouth 4 (four) times daily -  with meals and at bedtime. 08/03/14   Gwenlyn FoundJessica C Copland, MD  tadalafil (CIALIS) 5 MG tablet Take 1 tablet (5 mg total) by mouth daily as needed for erectile dysfunction. 11/04/13   Elvina SidleKurt Lauenstein, MD   History   Social History   Marital Status: Married    Spouse Name: N/A   Number of Children: N/A   Years of Education: N/A   Occupational History   Not on file.   Social History Main Topics   Smoking status: Never Smoker    Smokeless tobacco: Never Used   Alcohol Use: Yes     Comment: 2 daily    Drug Use: No   Sexual Activity: Not on file   Other Topics Concern   Not on file   Social History Narrative   Daily caffeine     Review of Systems  Constitutional: Negative for fever and chills.  Skin: Positive for color change and rash.       Objective:   Physical Exam  Constitutional: He appears well-developed. No distress.  Cardiovascular: Normal rate, regular rhythm and normal heart sounds.   Pulmonary/Chest: Effort normal and breath sounds normal. No respiratory distress. He has no wheezes. He has no rales.  Skin: Rash noted.  Erythematous linear rash with streaks more on the right lower leg than the left.   Nursing note and vitals reviewed.    Filed Vitals:   09/29/14 1242  BP: 132/76  Pulse: 69  Temp: 98 F (36.7 C)  TempSrc: Oral  Resp: 16  Height: 5' 9.5" (1.765 m)  Weight: 193 lb (87.544 kg)  SpO2: 99%        Assessment & Plan:   This chart was scribed in my presence and reviewed by me personally.    ICD-9-CM ICD-10-CM   1. Rash and nonspecific skin eruption 782.1 R21 permethrin (ACTICIN) 5 % cream     predniSONE (DELTASONE) 20 MG tablet  2. Combined arterial insufficiency and corporo-venous occlusive erectile dysfunction 607.84 N52.03 sildenafil (VIAGRA) 25 MG tablet     Signed, Elvina Sidle, MD

## 2015-02-28 ENCOUNTER — Other Ambulatory Visit: Payer: Self-pay | Admitting: Orthopedic Surgery

## 2015-02-28 DIAGNOSIS — R52 Pain, unspecified: Secondary | ICD-10-CM

## 2015-03-04 ENCOUNTER — Ambulatory Visit
Admission: RE | Admit: 2015-03-04 | Discharge: 2015-03-04 | Disposition: A | Discharge status: Home / Self Care | Payer: Self-pay | Source: Ambulatory Visit | Attending: Orthopedic Surgery | Admitting: Orthopedic Surgery

## 2015-03-04 DIAGNOSIS — R52 Pain, unspecified: Secondary | ICD-10-CM

## 2017-03-19 DIAGNOSIS — D3131 Benign neoplasm of right choroid: Secondary | ICD-10-CM | POA: Diagnosis not present

## 2017-03-19 DIAGNOSIS — H40053 Ocular hypertension, bilateral: Secondary | ICD-10-CM | POA: Diagnosis not present

## 2017-05-28 DIAGNOSIS — Z0000 Encounter for general adult medical examination without abnormal findings: Secondary | ICD-10-CM | POA: Diagnosis not present

## 2017-05-28 DIAGNOSIS — G4733 Obstructive sleep apnea (adult) (pediatric): Secondary | ICD-10-CM | POA: Diagnosis not present

## 2017-10-30 DIAGNOSIS — M79642 Pain in left hand: Secondary | ICD-10-CM | POA: Diagnosis not present

## 2017-10-30 DIAGNOSIS — M20022 Boutonniere deformity of left finger(s): Secondary | ICD-10-CM | POA: Diagnosis not present

## 2017-10-30 DIAGNOSIS — M79645 Pain in left finger(s): Secondary | ICD-10-CM | POA: Diagnosis not present

## 2017-10-30 DIAGNOSIS — M25642 Stiffness of left hand, not elsewhere classified: Secondary | ICD-10-CM | POA: Diagnosis not present

## 2018-02-25 ENCOUNTER — Encounter: Payer: Self-pay | Admitting: Pulmonary Disease

## 2018-02-25 ENCOUNTER — Ambulatory Visit: Payer: BLUE CROSS/BLUE SHIELD | Admitting: Pulmonary Disease

## 2018-02-25 VITALS — BP 118/68 | HR 52 | Ht 69.0 in | Wt 197.6 lb

## 2018-02-25 DIAGNOSIS — G4733 Obstructive sleep apnea (adult) (pediatric): Secondary | ICD-10-CM | POA: Diagnosis not present

## 2018-02-25 NOTE — Progress Notes (Signed)
[facility]  Chief Complaint  Patient presents with  . New Consult    Sleep consult, previously wore CPAP    Constitutional: BP 118/68 (BP Location: Right Arm, Cuff Size: Normal)   Pulse (!) 52   Ht 5\' 9"  (1.753 m)   Wt 197 lb 9.6 oz (89.6 kg)   SpO2 99%   BMI 29.18 kg/m   History of Present Illness: Gregg Lloyd is a 52 y.o. male with obstructive sleep apnea.  He had a sleep study in 2008.  Found to have moderate sleep apnea.  Tried on CPAP.  Tried different masks.  Had trouble with mask fit and pressure.  His sleep has been getting worse.  He still snores, and has been told he stops breathing while asleep.  He wakes up during dreams, and has trouble breathing when laying flat.  He has trouble staying awake when he gets home from work.  He goes to sleep at 930 pm.  He falls asleep in 15 minutes.  He wakes up 1 or 2 times to use the bathroom.  He gets out of bed at 630 am.  He feels okay in the morning.  He denies morning headache.  He does not use anything to help him fall sleep.  He drinks one cup of coffee in the morning.  He denies sleep walking, sleep talking, bruxism, or nightmares.  There is no history of restless legs.  He denies sleep hallucinations, sleep paralysis, or cataplexy.  The Epworth score is 7 out of 24.   Comprehensive Respiratory Exam:  Appearance - well kempt  ENMT - nasal mucosa moist, turbinates clear, midline nasal septum, no dental lesions, no gingival bleeding, no oral exudates, no tonsillar hypertrophy, MP 3, scalloped tongue Neck - no masses, trachea midline, no thyromegaly, no elevation in JVP Respiratory - normal appearance of chest wall, normal respiratory effort w/o accessory muscle use, no dullness on percussion, no wheezing or rales CV - s1s2 regular rate and rhythm, no murmurs, no peripheral edema, no varicosities, radial pulses symmetric GI - soft, non tender, no masses Lymph - no adenopathy noted in  neck and axillary areas MSK - normal muscle strength and tone, normal gait Ext - no cyanosis, clubbing, or joint inflammation noted Skin - no rashes, lesions, or ulcers Neuro - oriented to person, place, and time Psych - normal mood and affect  Discussion: He has snoring, sleep disruption, daytime sleepiness, and witnessed apnea.  He has prior history of sleep apnea.  He was tried on CPAP, but had trouble with mask fit and pressures settings then.  His sleep pattern has gotten worse.  Assessment/Plan:  Snoring with excessive daytime sleepiness. - will need to arrange for a home sleep study  Cardiovascular risk. - had an extensive discussion regarding the adverse health consequences related to untreated sleep disordered breathing - specifically discussed the risks for hypertension, coronary artery disease, cardiac dysrhythmias, cerebrovascular disease, and diabetes - lifestyle modification discussed  Safe driving practices. - discussed how sleep disruption can increase risk of accidents, particularly when driving - safe driving practices were discussed  Therapies for obstructive sleep apnea. - if the sleep study shows significant sleep apnea, then various therapies for treatment were reviewed: CPAP, oral appliance, and surgical interventions     Patient Instructions  Will arrange for home sleep study Will call to arrange for follow up after sleep study reviewed     Coralyn HellingVineet Emmagene Ortner, MD [facility] 02/25/2018, 9:58 AM  Flow Sheet  Sleep tests: PSG 04/30/07 >> AHI 19.2, SpO2 low 82%  Review of Systems: Constitutional: Negative for fever and unexpected weight change.  HENT: Negative for congestion, dental problem, ear pain, nosebleeds, postnasal drip, rhinorrhea, sinus pressure, sneezing, sore throat and trouble swallowing.   Eyes: Negative for redness and itching.  Respiratory: Negative for cough, chest tightness, shortness of breath and wheezing.     Cardiovascular: Negative for palpitations and leg swelling.  Gastrointestinal: Negative for nausea and vomiting.  Genitourinary: Negative for dysuria.  Musculoskeletal: Negative for joint swelling.  Skin: Negative for rash.  Allergic/Immunologic: Negative.  Negative for environmental allergies, food allergies and immunocompromised state.  Neurological: Negative for headaches.  Hematological: Does not bruise/bleed easily.  Psychiatric/Behavioral: Negative for dysphoric mood. The patient is not nervous/anxious.    Past Medical History: He  has a past medical history of Sleep apnea.  Past Surgical History: He  has a past surgical history that includes Knee surgery and Adenoidectomy.  Family History: His family history includes Stroke in his father.  Social History: He  reports that he has never smoked. He has never used smokeless tobacco. He reports that he drinks alcohol. He reports that he does not use drugs.  Medications: Allergies as of 02/25/2018   No Known Allergies     Medication List        Accurate as of 02/25/18  9:58 AM. Always use your most recent med list.          AMBULATORY NON FORMULARY MEDICATION CPAP Machine  As directed

## 2018-02-25 NOTE — Progress Notes (Signed)
   Subjective:    Patient ID: Gregg Lloyd, male    DOB: 09/18/1965, 52 y.o.   MRN: [id number]  HPI    Review of Systems  Constitutional: Negative for fever and unexpected weight change.  HENT: Negative for congestion, dental problem, ear pain, nosebleeds, postnasal drip, rhinorrhea, sinus pressure, sneezing, sore throat and trouble swallowing.   Eyes: Negative for redness and itching.  Respiratory: Negative for cough, chest tightness, shortness of breath and wheezing.   Cardiovascular: Negative for palpitations and leg swelling.  Gastrointestinal: Negative for nausea and vomiting.  Genitourinary: Negative for dysuria.  Musculoskeletal: Negative for joint swelling.  Skin: Negative for rash.  Allergic/Immunologic: Negative.  Negative for environmental allergies, food allergies and immunocompromised state.  Neurological: Negative for headaches.  Hematological: Does not bruise/bleed easily.  Psychiatric/Behavioral: Negative for dysphoric mood. The patient is not nervous/anxious.        Objective:   Physical Exam        Assessment & Plan:

## 2018-02-25 NOTE — Patient Instructions (Signed)
Will arrange for home sleep study Will call to arrange for follow up after sleep study reviewed  

## 2018-03-17 DIAGNOSIS — M79604 Pain in right leg: Secondary | ICD-10-CM | POA: Diagnosis not present

## 2018-03-17 DIAGNOSIS — S76911S Strain of unspecified muscles, fascia and tendons at thigh level, right thigh, sequela: Secondary | ICD-10-CM | POA: Diagnosis not present

## 2018-03-26 DIAGNOSIS — G4733 Obstructive sleep apnea (adult) (pediatric): Secondary | ICD-10-CM

## 2018-03-27 ENCOUNTER — Other Ambulatory Visit: Payer: Self-pay | Admitting: *Deleted

## 2018-03-27 ENCOUNTER — Telehealth: Payer: Self-pay | Admitting: Pulmonary Disease

## 2018-03-27 DIAGNOSIS — G4733 Obstructive sleep apnea (adult) (pediatric): Secondary | ICD-10-CM

## 2018-03-27 NOTE — Telephone Encounter (Signed)
Called and spoke with patient regarding results.  Informed the patient of results and recommendations today. Pt verbalized understanding and denied any questions or concerns at this time.  Pt will call back next week to schedule appt with NP

## 2018-03-27 NOTE — Telephone Encounter (Signed)
HST 03/26/18 >> AHI 6.2, SpO2 low 80%.   Please let him know his sleep study shows mild sleep apnea.  Schedule him for ROV with me or NP to discuss tx options.

## 2018-03-31 NOTE — Telephone Encounter (Signed)
Attempted to call patient today regarding in need to schedule appt with NP. I did not receive an answer at time of call. I have left a voicemail message for pt to return call. X1

## 2018-04-02 NOTE — Telephone Encounter (Signed)
Attempted to call patient today regarding in need to schedule appt with NP. I did not receive an answer at time of call. I have left a voicemail message for pt to return call. X2

## 2018-04-03 NOTE — Telephone Encounter (Signed)
Called and spoke with patient regarding appt with NP Pt has scheduled appt with EW on 04-15-18 Nothing further needed.

## 2018-04-13 NOTE — Progress Notes (Signed)
@Patient  ID: Gregg GunningJason Lloyd, male    DOB: 11/27/1965, 52 y.o.   MRN: [id number]  Chief Complaint  Patient presents with  . Follow-up    follow up on sleep study, CPAP    Referring provider: ViaCaryn Bee, Kevin, MD  HPI: 52 year old male. No significant past medical history. Patient of Dr. Craige CottaSood, seen for sleep consult on 02/25/18. HST 03/26/18 and showed AHI 6.2, SpO2 low 80%.   04/15/2018 Presents for OV to review sleep test results. Patient has used CPAP in the past and would like to resume. He has positive symptoms of snoring, mild daytime fatigue and reports that he hasn't slept through the night in 20 years. Working on weight loss by exercising and watching his diet. Goal is eventually not need cpap.    No Known Allergies  Immunization History  Administered Date(s) Administered  . Tdap 06/08/2009    Past Medical History:  Diagnosis Date  . Sleep apnea    CPAP Machine     Tobacco History: Social History   Tobacco Use  Smoking Status Never Smoker  Smokeless Tobacco Never Used   Counseling given: Not Answered   Outpatient Medications Prior to Visit  Medication Sig Dispense Refill  . AMBULATORY NON FORMULARY MEDICATION CPAP Machine  As directed     No facility-administered medications prior to visit.     Review of Systems  Review of Systems  Constitutional: Positive for fatigue.  HENT: Negative.   Respiratory: Negative.   Cardiovascular: Negative.   Psychiatric/Behavioral: Positive for sleep disturbance.    Physical Exam  BP 110/82 (BP Location: Left Arm, Cuff Size: Normal)   Pulse (!) 52   Ht 5\' 9"  (1.753 m)   Wt 196 lb 6.4 oz (89.1 kg)   SpO2 98%   BMI 29.00 kg/m  Physical Exam  Constitutional: He is oriented to person, place, and time. He appears well-developed and well-nourished.  HENT:  Head: Normocephalic and atraumatic.  Mallampati class II. Approx neck circumference 17.5   Eyes: Pupils are equal, round, and reactive to light. EOM are normal.    Neck: Normal range of motion. Neck supple.  Cardiovascular: Normal rate, regular rhythm, normal heart sounds and intact distal pulses.  Pulmonary/Chest: Effort normal and breath sounds normal. No respiratory distress. He has no wheezes.  Abdominal: Soft. Bowel sounds are normal. There is no tenderness.  Neurological: He is alert and oriented to person, place, and time.  Skin: Skin is warm and dry. No rash noted. No erythema.  Psychiatric: He has a normal mood and affect. His behavior is normal. Judgment normal.     Lab Results:  CBC    Component Value Date/Time   WBC 5.2 08/03/2014 1302   RBC 5.40 08/03/2014 1302   HGB 16.5 08/03/2014 1302   HCT 45.9 08/03/2014 1302   PLT 209 08/03/2014 1302   MCV 85.0 08/03/2014 1302   MCH 30.6 08/03/2014 1302   MCHC 35.9 08/03/2014 1302   RDW 13.7 08/03/2014 1302   LYMPHSABS 1.3 04/09/2012 0737   MONOABS 0.5 04/09/2012 0737   EOSABS 0.1 04/09/2012 0737   BASOSABS 0.0 04/09/2012 0737    BMET    Component Value Date/Time   NA 136 08/03/2014 1302   K 4.2 08/03/2014 1302   CL 101 08/03/2014 1302   CO2 26 08/03/2014 1302   GLUCOSE 98 08/03/2014 1302   BUN 13 08/03/2014 1302   CREATININE 0.71 08/03/2014 1302   CALCIUM 9.7 08/03/2014 1302    BNP No results  found for: BNP  ProBNP No results found for: PROBNP  Imaging: No results found.   Assessment & Plan:   OSA (obstructive sleep apnea) New CPAP start; auto titrate 5-15cm H20 with humidification/mask of choice and supplies Goal wear 4-6 hours every night. Instructed not to drive if experiencing excessive daytime fatigue  Work on weight loss   FU in 6 weeks with [employer]      Glenford Bayley, NP 04/15/2018

## 2018-04-15 ENCOUNTER — Ambulatory Visit (INDEPENDENT_AMBULATORY_CARE_PROVIDER_SITE_OTHER): Payer: BLUE CROSS/BLUE SHIELD | Admitting: Primary Care

## 2018-04-15 ENCOUNTER — Encounter: Payer: Self-pay | Admitting: Primary Care

## 2018-04-15 VITALS — BP 110/82 | HR 52 | Ht 69.0 in | Wt 196.4 lb

## 2018-04-15 DIAGNOSIS — G4733 Obstructive sleep apnea (adult) (pediatric): Secondary | ICD-10-CM

## 2018-04-15 NOTE — Progress Notes (Signed)
Reviewed and agree with assessment/plan.   Emeterio Balke, MD [facility] 07/24/2016, 12:24 PM Pager:  [phone]  

## 2018-04-15 NOTE — Patient Instructions (Signed)
DME referral for new CPAP start- Home CPAP auto titrate 5-15cm H20, mask of choice, humidification and supplies  Goal is to wear cpap mask for 4-6 hours every night. Do not drive if experiencing excessive daytime fatigue or Somnolence   FU in 6 weeks with download after cpap start

## 2018-04-15 NOTE — Assessment & Plan Note (Signed)
New CPAP start; auto titrate 5-15cm H20 with humidification/mask of choice and supplies Goal wear 4-6 hours every night. Instructed not to drive if experiencing excessive daytime fatigue  Work on weight loss   FU in 6 weeks with [employer] download

## 2018-05-04 DIAGNOSIS — G4733 Obstructive sleep apnea (adult) (pediatric): Secondary | ICD-10-CM | POA: Diagnosis not present

## 2018-05-04 DIAGNOSIS — D229 Melanocytic nevi, unspecified: Secondary | ICD-10-CM | POA: Diagnosis not present

## 2018-05-04 DIAGNOSIS — L723 Sebaceous cyst: Secondary | ICD-10-CM | POA: Diagnosis not present

## 2018-05-04 DIAGNOSIS — L738 Other specified follicular disorders: Secondary | ICD-10-CM | POA: Diagnosis not present

## 2018-05-27 ENCOUNTER — Ambulatory Visit: Payer: BLUE CROSS/BLUE SHIELD | Admitting: Primary Care

## 2018-05-27 NOTE — Progress Notes (Deleted)
 @  Patient ID: Gregg Lloyd, male    DOB: 05-Dec-1965, 52 y.o.   MRN: [id number]  No chief complaint on file.   Referring provider: ViaCaryn Bee, MD  HPI: 52 year old male. No significant past medical history. Patient of Dr. Craige Cotta, seen for sleep consult on 02/25/18. HST 03/26/18 and showed AHI 6.2, SpO2 low 80%.   04/15/2018 Presents for OV to review sleep test results. Patient has used CPAP in the past and would like to resume. He has positive symptoms of snoring, mild daytime fatigue and reports that he hasn't slept through the night in 20 years. Working on weight loss by exercising and watching his diet. Goal is eventually not need cpap.   05/27/2018 Patient presents today for 6 week OV, new CPAP start.       No Known Allergies  Immunization History  Administered Date(s) Administered  . Tdap 06/08/2009    Past Medical History:  Diagnosis Date  . Sleep apnea    CPAP Machine     Tobacco History: Social History   Tobacco Use  Smoking Status Never Smoker  Smokeless Tobacco Never Used   Counseling given: Not Answered   Outpatient Medications Prior to Visit  Medication Sig Dispense Refill  . AMBULATORY NON FORMULARY MEDICATION CPAP Machine  As directed     No facility-administered medications prior to visit.       Review of Systems  Review of Systems   Physical Exam  There were no vitals taken for this visit. Physical Exam   Lab Results:  CBC    Component Value Date/Time   WBC 5.2 08/03/2014 1302   RBC 5.40 08/03/2014 1302   HGB 16.5 08/03/2014 1302   HCT 45.9 08/03/2014 1302   PLT 209 08/03/2014 1302   MCV 85.0 08/03/2014 1302   MCH 30.6 08/03/2014 1302   MCHC 35.9 08/03/2014 1302   RDW 13.7 08/03/2014 1302   LYMPHSABS 1.3 04/09/2012 0737   MONOABS 0.5 04/09/2012 0737   EOSABS 0.1 04/09/2012 0737   BASOSABS 0.0 04/09/2012 0737    BMET    Component Value Date/Time   NA 136 08/03/2014 1302   K 4.2 08/03/2014 1302   CL 101 08/03/2014 1302    CO2 26 08/03/2014 1302   GLUCOSE 98 08/03/2014 1302   BUN 13 08/03/2014 1302   CREATININE 0.71 08/03/2014 1302   CALCIUM 9.7 08/03/2014 1302    BNP No results found for: BNP  ProBNP No results found for: PROBNP  Imaging: No results found.   Assessment & Plan:   No problem-specific Assessment & Plan notes found for this encounter.     Glenford Bayley, NP 05/27/2018

## 2018-05-31 NOTE — Progress Notes (Signed)
@Patient  ID: Gregg Lloyd, male    DOB: 1966-05-06, 52 y.o.   MRN: [id number]  No chief complaint on file.   Referring provider: ViaCaryn Bee, MD  HPI:  52 year old male never smoker followed in office for obstructive sleep apnea.  Smoker/ Smoking History: Never smoker maintenance:   Pt of: Sood  06/01/2018  - Visit   52 year old male patient presenting today for follow-up visit after CPAP start.  Patient was found to have mild obstructive sleep apnea on 03/26/18 sleep study.  Patient reports that things have been going well with new CPAP start.  Patient reports he has not been able to use it consistently as he travels often for work.  Patient reports that he does feel better and feels more well rested after using CPAP.  CPAP compliance report showing 11 out of 30 days use.  4 of those days greater than 4 hours.  Average usage 2 hours and 52 minutes.  APAP settings 5-15.  95th percentile 8.5, AHI 0.3.  Patient is using a nasal mask.  Patient reports that this fits well and reporting very little leaks.  Patient does report that his insurance will probably change in March/2020 may have different insurance with better coverage. Pt may consider oral appliance at that time.      Tests:  HST 03/26/18 and showed AHI 6.2, SpO2 low 80%.   FENO:  No results found for: NITRICOXIDE  PFT: No flowsheet data found.  Imaging: No results found.  Chart Review:    Specialty Problems      Pulmonary Problems   OSA (obstructive sleep apnea)    HST 03/26/18 and showed AHI 6.2, SpO2 low 80% New CPAP start 03/2018           No Known Allergies  Immunization History  Administered Date(s) Administered  . Tdap 06/08/2009   Patient declines flu vaccine today  Past Medical History:  Diagnosis Date  . Sleep apnea    CPAP Machine     Tobacco History: Social History   Tobacco Use  Smoking Status Never Smoker  Smokeless Tobacco Never Used   Counseling given: Not Answered  Continue  to not smoke  Outpatient Encounter Medications as of 06/01/2018  Medication Sig  . AMBULATORY NON FORMULARY MEDICATION CPAP Machine  As directed   No facility-administered encounter medications on file as of 06/01/2018.      Review of Systems  Review of Systems  Constitutional: Negative for activity change, chills, fatigue, fever and unexpected weight change.  HENT: Negative for postnasal drip, rhinorrhea, sinus pressure, sinus pain, sneezing and sore throat.   Eyes: Negative.   Respiratory: Negative for cough, shortness of breath and wheezing.   Cardiovascular: Negative for chest pain and palpitations.  Gastrointestinal: Negative for constipation, diarrhea, nausea and vomiting.  Endocrine: Negative.   Musculoskeletal: Negative.   Skin: Negative.   Neurological: Negative for dizziness and headaches.  Psychiatric/Behavioral: Negative.  Negative for dysphoric mood. The patient is not nervous/anxious.   All other systems reviewed and are negative.    Physical Exam  BP 122/82 (BP Location: Left Arm, Cuff Size: Normal)   Pulse 60   Ht 5\' 9"  (1.753 m)   Wt 200 lb (90.7 kg)   SpO2 100%   BMI 29.53 kg/m   Wt Readings from Last 5 Encounters:  06/01/18 200 lb (90.7 kg)  04/15/18 196 lb 6.4 oz (89.1 kg)  02/25/18 197 lb 9.6 oz (89.6 kg)  09/29/14 193 lb (87.5 kg)  11/04/13 191  lb 3.2 oz (86.7 kg)    Physical Exam  Constitutional: He is oriented to person, place, and time and well-developed, well-nourished, and in no distress. No distress.  HENT:  Head: Normocephalic and atraumatic.  Right Ear: Hearing, tympanic membrane, external ear and ear canal normal.  Left Ear: Hearing, tympanic membrane, external ear and ear canal normal.  Nose: Nose normal. Right sinus exhibits no maxillary sinus tenderness and no frontal sinus tenderness. Left sinus exhibits no maxillary sinus tenderness and no frontal sinus tenderness.  Mouth/Throat: Uvula is midline and oropharynx is clear and moist.  No oropharyngeal exudate.  +mallampati III   Eyes: Pupils are equal, round, and reactive to light.  Neck: Normal range of motion. Neck supple. No JVD present.  Cardiovascular: Normal rate, regular rhythm and normal heart sounds.  Pulmonary/Chest: Effort normal and breath sounds normal. No accessory muscle usage. No respiratory distress. He has no decreased breath sounds. He has no wheezes. He has no rhonchi.  Musculoskeletal: Normal range of motion. He exhibits no edema.  Lymphadenopathy:    He has no cervical adenopathy.  Neurological: He is alert and oriented to person, place, and time. Gait normal.  Skin: Skin is warm and dry. He is not diaphoretic. No erythema.  Psychiatric: Mood, memory, affect and judgment normal.  Nursing note and vitals reviewed.     Lab Results:  CBC    Component Value Date/Time   WBC 5.2 08/03/2014 1302   RBC 5.40 08/03/2014 1302   HGB 16.5 08/03/2014 1302   HCT 45.9 08/03/2014 1302   PLT 209 08/03/2014 1302   MCV 85.0 08/03/2014 1302   MCH 30.6 08/03/2014 1302   MCHC 35.9 08/03/2014 1302   RDW 13.7 08/03/2014 1302   LYMPHSABS 1.3 04/09/2012 0737   MONOABS 0.5 04/09/2012 0737   EOSABS 0.1 04/09/2012 0737   BASOSABS 0.0 04/09/2012 0737    BMET    Component Value Date/Time   NA 136 08/03/2014 1302   K 4.2 08/03/2014 1302   CL 101 08/03/2014 1302   CO2 26 08/03/2014 1302   GLUCOSE 98 08/03/2014 1302   BUN 13 08/03/2014 1302   CREATININE 0.71 08/03/2014 1302   CALCIUM 9.7 08/03/2014 1302    BNP No results found for: BNP  ProBNP No results found for: PROBNP    Assessment & Plan:   52 year old male patient presenting today for follow-up visit.  Patient with adequate compliance with CPAP.  Patient uses it when he is not traveling.  He reports that he uses it every day when he is at home.  Could consider portable CPAP order or oral appliance in the future.  Patient would like to proceed forward on current therapy at this time.  We will  bring patient back in 6 months.  We will do 83-month download to check compliance.  Patient can contact our office at any time if you would like a referral for an oral appliance.  OSA (obstructive sleep apnea) Follow up in 6 months  >>>3month download   We could consider oral appliance in the future   We recommend that you continue using your CPAP daily >>>Keep up the hard work using your device >>> Goal should be wearing this for the entire night that you are sleeping, at least 4 to 6 hours  Remember:  . Do not drive or operate heavy machinery if tired or drowsy.  . Please notify the supply company and office if you are unable to use your device regularly due to  missing supplies or machine being broken.  . Work on maintaining a healthy weight and following your recommended nutrition plan  . Maintain proper daily exercise and movement  . Maintaining proper use of your device can also help improve management of other chronic illnesses such as: Blood pressure, blood sugars, and weight management.   BiPAP/ CPAP Cleaning:  >>>Clean weekly, with Dawn soap, and bottle brush.  Set up to air dry.     Coral Ceo, NP 06/01/2018

## 2018-06-01 ENCOUNTER — Encounter: Payer: Self-pay | Admitting: Pulmonary Disease

## 2018-06-01 ENCOUNTER — Ambulatory Visit: Payer: BLUE CROSS/BLUE SHIELD | Admitting: Pulmonary Disease

## 2018-06-01 DIAGNOSIS — G4733 Obstructive sleep apnea (adult) (pediatric): Secondary | ICD-10-CM

## 2018-06-01 NOTE — Progress Notes (Signed)
Reviewed and agree with assessment/plan.   Emma Schupp, MD [facility] 07/24/2016, 12:24 PM Pager:  [phone]  

## 2018-06-01 NOTE — Patient Instructions (Addendum)
Follow up in 6 months  >>>2month download   We could consider oral appliance in the future   We recommend that you continue using your CPAP daily >>>Keep up the hard work using your device >>> Goal should be wearing this for the entire night that you are sleeping, at least 4 to 6 hours  Remember:  . Do not drive or operate heavy machinery if tired or drowsy.  . Please notify the supply company and office if you are unable to use your device regularly due to missing supplies or machine being broken.  . Work on maintaining a healthy weight and following your recommended nutrition plan  . Maintain proper daily exercise and movement  . Maintaining proper use of your device can also help improve management of other chronic illnesses such as: Blood pressure, blood sugars, and weight management.   BiPAP/ CPAP Cleaning:  >>>Clean weekly, with Dawn soap, and bottle brush.  Set up to air dry.     November/2019 we will be moving! We will no longer be at our [place] location.  Be on the look out for a post card/mailer to let you know we have officially moved.  Our new address and phone number will be:  [address] [place], [place] [address] Telephone number: [phone]  It is flu season:   >>>Remember to be washing your hands regularly, using hand sanitizer, be careful to use around herself with has contact with people who are sick will increase her chances of getting sick yourself. >>> Best ways to protect herself from the flu: Receive the yearly flu vaccine, practice good hand hygiene washing with soap and also using hand sanitizer when available, eat a nutritious meals, get adequate rest, hydrate appropriately   Please contact the office if your symptoms worsen or you have concerns that you are not improving.   Thank you for choosing [facility] for your healthcare, and for allowing [place] to partner with you on your healthcare journey. I am thankful to be able to provide  care to you today.   Elisha Headland FNP-C

## 2018-06-01 NOTE — Assessment & Plan Note (Signed)
Follow up in 6 months  >>>52month download   We could consider oral appliance in the future   We recommend that you continue using your CPAP daily >>>Keep up the hard work using your device >>> Goal should be wearing this for the entire night that you are sleeping, at least 4 to 6 hours  Remember:  . Do not drive or operate heavy machinery if tired or drowsy.  . Please notify the supply company and office if you are unable to use your device regularly due to missing supplies or machine being broken.  . Work on maintaining a healthy weight and following your recommended nutrition plan  . Maintain proper daily exercise and movement  . Maintaining proper use of your device can also help improve management of other chronic illnesses such as: Blood pressure, blood sugars, and weight management.   BiPAP/ CPAP Cleaning:  >>>Clean weekly, with Dawn soap, and bottle brush.  Set up to air dry.

## 2018-06-01 NOTE — Addendum Note (Signed)
Addended by: Pilar Grammes on: 06/01/2018 10:18 AM   Modules accepted: Orders

## 2018-06-04 DIAGNOSIS — G4733 Obstructive sleep apnea (adult) (pediatric): Secondary | ICD-10-CM | POA: Diagnosis not present

## 2018-07-04 DIAGNOSIS — G4733 Obstructive sleep apnea (adult) (pediatric): Secondary | ICD-10-CM | POA: Diagnosis not present

## 2018-07-13 DIAGNOSIS — G4733 Obstructive sleep apnea (adult) (pediatric): Secondary | ICD-10-CM | POA: Diagnosis not present

## 2018-07-13 DIAGNOSIS — Z7689 Persons encountering health services in other specified circumstances: Secondary | ICD-10-CM | POA: Diagnosis not present

## 2018-08-03 ENCOUNTER — Telehealth: Payer: Self-pay | Admitting: Pulmonary Disease

## 2018-08-03 DIAGNOSIS — G4733 Obstructive sleep apnea (adult) (pediatric): Secondary | ICD-10-CM

## 2018-08-03 NOTE — Telephone Encounter (Signed)
08/03/2018 1628  Patient was reached by my nurse Lauren.  Patient did want a referral for an oral appliance.  Will refer patient to Dr. Toni Arthurs.  We will refer you to Dr. Irene Limbo for your oral appliance  [address] [place] [place], [place] [address] Phone: [phone] Website: [email or web address]  You will need to have six-month follow-up with Dr. Toni Arthurs or your dentist to ensure your proper dentition and no issues with oral appliance.   We will also need to have you follow-up in 6 months with our office with a home sleep study to ensure that your sleep apnea is being treated adequately.   Keep follow-up with our office.

## 2018-08-03 NOTE — Telephone Encounter (Signed)
Called and spoke with patient. He said he has been under the weather lately and not wearing his machine. He also recently purchased a cleaning machine for his CPAP machine. He will continue CPAP and would also want the referral regarding oral appliance Order placed by NP.  Nothing further needed at this time.

## 2018-08-03 NOTE — Telephone Encounter (Signed)
08/03/2018 0858  Received compliant download on patient's use of CPAP.  It is listed below:  CPAP compliance report showing 4 out of 30 days use, all all 4 of those days less than 4 hours, average usage 2 hours and 35 minutes, APAP settings 5-15, AHI 0.4.  I remember the patient travels a lot.  Can we contact the patient and check to see how he is doing as we noticed that he has not been compliant with his CPAP.  If the patient has reconsidered his stance and would like a referral for an oral appliance which we discussed at her last office visit I am more than happy to place that referral.  Elisha Headland FNP

## 2018-08-04 DIAGNOSIS — G4733 Obstructive sleep apnea (adult) (pediatric): Secondary | ICD-10-CM | POA: Diagnosis not present

## 2018-09-04 DIAGNOSIS — G4733 Obstructive sleep apnea (adult) (pediatric): Secondary | ICD-10-CM | POA: Diagnosis not present

## 2018-10-03 DIAGNOSIS — G4733 Obstructive sleep apnea (adult) (pediatric): Secondary | ICD-10-CM | POA: Diagnosis not present

## 2018-11-27 NOTE — Progress Notes (Signed)
Virtual Visit via Telephone Note  I connected with Oron Poppen on 11/30/18 at  9:00 AM EDT by telephone and verified that I am speaking with the correct person using two identifiers.   I discussed the limitations, risks, security and privacy concerns of performing an evaluation and management service by telephone and the availability of in person appointments. I also discussed with the patient that there may be a patient responsible charge related to this service. The patient expressed understanding and agreed to proceed.   History of Present Illness: 53 year old male never smoker followed in office for obstructive sleep apnea.  Smoker/ Smoking History: Never smoker  Pt of: Sood  Patient consented to consult via telephone: Yes  People present and their role in pt care: Pt   Chief complaint: OSA   53 year old male followed in our office for mild obstructive sleep apnea.  Patient was initially started on CPAP but CPAP therapy did not fit the patient schedule.  Patient travels often for work and was struggling to get over 4 hours worth of use as well as maintain compliance with CPAP due to his active travel schedule.  In January/2020 patient wanted to be referred to Dr. Alveda Reasons office for an oral appliance.  Patient reports that he is completed a follow-up with Dr. Toni Arthurs and they are proceeding forward with an oral appliance device.  Patient has not been using his CPAP during this time.  Patient is looking forward to starting oral appliance therapy.   Observations/Objective:  HST 03/26/18 and showed AHI 6.2, SpO2 low 80%.   No results found for: NITRICOXIDE  Assessment and Plan:  OSA (obstructive sleep apnea) A:  Mild OSA on 02/2018 HST  Working with Dr. Toni Arthurs - oral applaince  P:  Continue to follow up with Dr. Toni Arthurs for an oral appliance  Follow up with our office as needed Repeat HST in 81mo after oral appliance is fit    Follow Up Instructions:  Return if symptoms worsen  or fail to improve, for Follow up with Elisha Headland FNP-C, Follow up with Dr. Craige Cotta.    I discussed the assessment and treatment plan with the patient. The patient was provided an opportunity to ask questions and all were answered. The patient agreed with the plan and demonstrated an understanding of the instructions.   The patient was advised to call back or seek an in-person evaluation if the symptoms worsen or if the condition fails to improve as anticipated.  I provided 14 minutes of non-face-to-face time during this encounter.   Coral Ceo, NP

## 2018-11-30 ENCOUNTER — Ambulatory Visit (INDEPENDENT_AMBULATORY_CARE_PROVIDER_SITE_OTHER): Payer: 59 | Admitting: Pulmonary Disease

## 2018-11-30 ENCOUNTER — Other Ambulatory Visit: Payer: Self-pay

## 2018-11-30 ENCOUNTER — Encounter: Payer: Self-pay | Admitting: Pulmonary Disease

## 2018-11-30 DIAGNOSIS — G4733 Obstructive sleep apnea (adult) (pediatric): Secondary | ICD-10-CM

## 2018-11-30 NOTE — Assessment & Plan Note (Addendum)
A:  Mild OSA on 02/2018 HST  Working with Dr. Toni Arthurs - oral applaince  P:  Continue to follow up with Dr. Toni Arthurs for an oral appliance  Follow up with our office as needed Repeat HST in 63mo after oral appliance is fit

## 2018-11-30 NOTE — Patient Instructions (Signed)
Continue to follow-up with Dr. Toni ArthursFuller  After you have your oral appliance fit and you were using it for about 6 months contact our office so that we can place an order for home sleep study  Follow-up with our office as needed  Return if symptoms worsen or fail to improve, for Follow up with Gregg HeadlandBrian  FNP-C, Follow up with Dr. Craige CottaSood.   Coronavirus (COVID-19) Are you at risk?  Are you at risk for the Coronavirus (COVID-19)?  To be considered HIGH RISK for Coronavirus (COVID-19), you have to meet the following criteria:  . Traveled to [place], [place], [place], [place] or [place]; or in the [place] to [place], [place], [place], or [place]; and have fever, cough, and shortness of breath within the last 2 weeks of travel OR . Been in close contact with a person diagnosed with COVID-19 within the last 2 weeks and have fever, cough, and shortness of breath . IF YOU DO NOT MEET THESE CRITERIA, YOU ARE CONSIDERED LOW RISK FOR COVID-19.  What to do if you are HIGH RISK for COVID-19?  Marland Kitchen. If you are having a medical emergency, call 911. . Seek medical care right away. Before you go to a doctor's office, urgent care or emergency department, call ahead and tell them about your recent travel, contact with someone diagnosed with COVID-19, and your symptoms. You should receive instructions from your physician's office regarding next steps of care.  . When you arrive at healthcare provider, tell the healthcare staff immediately you have returned from visiting [place], [place], [place], [place] or [place]; or traveled in the [place] to [place], [place], [place], or [place]; in the last two weeks or you have been in close contact with a person diagnosed with COVID-19 in the last 2 weeks.   . Tell the health care staff about your symptoms: fever, cough and shortness of breath. . After you have been seen by a medical provider, you will be either: o Tested for (COVID-19) and discharged home  on quarantine except to seek medical care if symptoms worsen, and asked to  - Stay home and avoid contact with others until you get your results (4-5 days)  - Avoid travel on public transportation if possible (such as bus, train, or airplane) or o Sent to the Emergency Department by EMS for evaluation, COVID-19 testing, and possible admission depending on your condition and test results.  What to do if you are LOW RISK for COVID-19?  Reduce your risk of any infection by using the same precautions used for avoiding the common cold or flu:  Marland Kitchen. Wash your hands often with soap and warm water for at least 20 seconds.  If soap and water are not readily available, use an alcohol-based hand sanitizer with at least 60% alcohol.  . If coughing or sneezing, cover your mouth and nose by coughing or sneezing into the elbow areas of your shirt or coat, into a tissue or into your sleeve (not your hands). . Avoid shaking hands with others and consider head nods or verbal greetings only. . Avoid touching your eyes, nose, or mouth with unwashed hands.  . Avoid close contact with people who are sick. . Avoid places or events with large numbers of people in one location, like concerts or sporting events. . Carefully consider travel plans you have or are making. . If you are planning any travel outside or inside the [place], visit the CDC's Travelers' Health webpage for  the latest health notices. . If you have some symptoms but not all symptoms, continue to monitor at home and seek medical attention if your symptoms worsen. . If you are having a medical emergency, call 911.   ADDITIONAL HEALTHCARE OPTIONS FOR PATIENTS  [facility] / e-Visit: [email or web address]         [facility]: [phone]  [facility]: [phone]                   [facility]: [phone]           It is flu season:   >>> Best ways to  protect herself from the flu: Receive the yearly flu vaccine, practice good hand hygiene washing with soap and also using hand sanitizer when available, eat a nutritious meals, get adequate rest, hydrate appropriately   Please contact the office if your symptoms worsen or you have concerns that you are not improving.   Thank you for choosing [facility] for your healthcare, and for allowing [place] to partner with you on your healthcare journey. I am thankful to be able to provide care to you today.   Gregg Headland FNP-C

## 2019-03-09 NOTE — Progress Notes (Signed)
Reviewed and agree with assessment/plan.   Johana Hopkinson, MD  Pulmonary/Critical Care 07/24/2016, 12:24 PM Pager:  [phone]  

## 2019-05-12 ENCOUNTER — Other Ambulatory Visit: Payer: Self-pay

## 2019-05-12 DIAGNOSIS — Z20822 Contact with and (suspected) exposure to covid-19: Secondary | ICD-10-CM

## 2019-05-13 LAB — NOVEL CORONAVIRUS, NAA: SARS-CoV-2, NAA: NOT DETECTED

## 2022-08-14 ENCOUNTER — Encounter: Payer: Self-pay | Admitting: Gastroenterology

## 2023-07-01 ENCOUNTER — Ambulatory Visit (AMBULATORY_SURGERY_CENTER): Payer: 59

## 2023-07-01 VITALS — Ht 69.0 in | Wt 195.0 lb

## 2023-07-01 DIAGNOSIS — Z1211 Encounter for screening for malignant neoplasm of colon: Secondary | ICD-10-CM

## 2023-07-01 MED ORDER — NA SULFATE-K SULFATE-MG SULF 17.5-3.13-1.6 GM/177ML PO SOLN: 1.0000 | Freq: Once | ORAL | 0 refills | Status: AC

## 2023-07-01 NOTE — Progress Notes (Signed)

## 2023-07-15 ENCOUNTER — Encounter: Payer: Self-pay | Admitting: Gastroenterology

## 2023-07-25 ENCOUNTER — Encounter: Payer: Self-pay | Admitting: Gastroenterology

## 2023-07-25 ENCOUNTER — Ambulatory Visit (AMBULATORY_SURGERY_CENTER): Payer: 59 | Admitting: Gastroenterology

## 2023-07-25 VITALS — BP 128/86 | HR 52 | Temp 98.0°F | Resp 13 | Ht 69.0 in | Wt 195.0 lb

## 2023-07-25 DIAGNOSIS — D123 Benign neoplasm of transverse colon: Secondary | ICD-10-CM

## 2023-07-25 DIAGNOSIS — Z1211 Encounter for screening for malignant neoplasm of colon: Secondary | ICD-10-CM | POA: Diagnosis present

## 2023-07-25 DIAGNOSIS — D125 Benign neoplasm of sigmoid colon: Secondary | ICD-10-CM

## 2023-07-25 DIAGNOSIS — D122 Benign neoplasm of ascending colon: Secondary | ICD-10-CM | POA: Diagnosis not present

## 2023-07-25 DIAGNOSIS — K635 Polyp of colon: Secondary | ICD-10-CM

## 2023-07-25 DIAGNOSIS — K640 First degree hemorrhoids: Secondary | ICD-10-CM | POA: Diagnosis not present

## 2023-07-25 MED ORDER — SODIUM CHLORIDE 0.9 % IV SOLN: 500.0000 mL | Freq: Once | INTRAVENOUS | Status: DC

## 2023-07-25 NOTE — Progress Notes (Signed)
Sedate, gd SR, tolerated procedure well, VSS, report to RN 

## 2023-07-25 NOTE — Progress Notes (Signed)
History & Physical  Primary Care Physician:  Gregg Boop, MD Primary Gastroenterologist: Gregg Head, MD  Impression / Plan:  Average risk CRC screening for colonoscopy.  CHIEF COMPLAINT:  CRC screening   HPI: Gregg Lloyd is a 57 y.o. male average risk CRC screening for colonoscopy.    Past Medical History:  Diagnosis Date   Sleep apnea    CPAP Machine     Past Surgical History:  Procedure Laterality Date   ADENOIDECTOMY     KNEE SURGERY      Prior to Admission medications   Medication Sig Start Date End Date Taking? Authorizing Provider  AMBULATORY NON FORMULARY MEDICATION CPAP Machine  As directed Patient not taking: Reported on 07/25/2023    [provider]    Current Outpatient Medications  Medication Sig Dispense Refill   AMBULATORY NON FORMULARY MEDICATION CPAP Machine  As directed (Patient not taking: Reported on 07/25/2023)     Current Facility-Administered Medications  Medication Dose Route Frequency Provider Last Rate Last Admin   0.9 %  sodium chloride infusion  500 mL Intravenous Once Gregg Dare, MD        Allergies as of 07/25/2023   (No Known Allergies)    Family History  Problem Relation Age of Onset   Stroke Father    Colon cancer Neg Hx    Colon polyps Neg Hx    Esophageal cancer Neg Hx    Rectal cancer Neg Hx    Stomach cancer Neg Hx     Social History   Socioeconomic History   Marital status: Married    Spouse name: Not on file   Number of children: Not on file   Years of education: Not on file   Highest education level: Not on file  Occupational History   Not on file  Tobacco Use   Smoking status: Never   Smokeless tobacco: Never  Vaping Use   Vaping status: Never Used  Substance and Sexual Activity   Alcohol use: Yes    Comment: 2 daily    Drug use: No   Sexual activity: Not on file  Other Topics Concern   Not on file  Social History Narrative   Daily caffeine    Social Drivers of Health    Financial Resource Strain: Not on file  Food Insecurity: Not on file  Transportation Needs: Not on file  Physical Activity: Not on file  Stress: Not on file  Social Connections: Not on file  Intimate Partner Violence: Not on file    Review of Systems:  All systems reviewed were negative except where noted in HPI.   Physical Exam:   General:  Alert, well-developed, in NAD Lloyd:  Normocephalic and atraumatic. Eyes:  Sclera clear, no icterus.   Conjunctiva pink. Ears:  Normal auditory acuity. Mouth:  No deformity or lesions.  Neck:  Supple; no masses. Lungs:  Clear throughout to auscultation.   No wheezes, crackles, or rhonchi.  Heart:  Regular rate and rhythm; no murmurs. Abdomen:  Soft, nondistended, nontender. No masses, hepatomegaly. No palpable masses.  Normal bowel sounds.    Rectal:  Deferred   Msk:  Symmetrical without gross deformities. Extremities:  Without edema. Neurologic:  Alert and  oriented x 4; grossly normal neurologically. Skin:  Intact without significant lesions or rashes. Psych:  Alert and cooperative. Normal mood and affect.   Gregg Lloyd. Gregg Lloyd  07/25/2023, 7:52 AM See Gregg Lloyd, [facility], to contact our on call provider

## 2023-07-25 NOTE — Op Note (Signed)
[name] Procedure Date: 07/25/2023 7:37 AM MRN: [id number] Endoscopist: Meryl Dare , MD, [phone] Age: 57 Referring MD:  Date of Birth: 05-08-1966 Gender: Male Account #: [id number] Procedure:                Colonoscopy Indications:              Screening for colorectal malignant neoplasm Medicines:                Monitored Anesthesia Care Procedure:                Pre-Anesthesia Assessment:                           - Prior to the procedure, a History and Physical                            was performed, and patient medications and                            allergies were reviewed. The patient's tolerance of                            previous anesthesia was also reviewed. The risks                            and benefits of the procedure and the sedation                            options and risks were discussed with the patient.                            All questions were answered, and informed consent                            was obtained. Prior Anticoagulants: The patient has                            taken no anticoagulant or antiplatelet agents. ASA                            Grade Assessment: II - A patient with mild systemic                            disease. After reviewing the risks and benefits,                            the patient was deemed in satisfactory condition to                            undergo the procedure.                           After obtaining informed consent, the colonoscope  was passed under direct vision. Throughout the                            procedure, the patient's blood pressure, pulse, and                            oxygen saturations were monitored continuously. The                            CF [id number] #[id number] was introduced through the anus                            and advanced to the the cecum, identified by                            appendiceal orifice  and ileocecal valve. The                            ileocecal valve, appendiceal orifice, and rectum                            were photographed. The quality of the bowel                            preparation was good. The colonoscopy was performed                            without difficulty. The patient tolerated the                            procedure well. Scope In: 7:57:43 AM Scope Out: 8:15:36 AM Scope Withdrawal Time: 0 hours 15 minutes 29 seconds  Total Procedure Duration: 0 hours 17 minutes 53 seconds  Findings:                 The perianal and digital rectal examinations were                            normal.                           Five sessile polyps were found in the sigmoid colon                            (2), transverse colon (2) and ascending colon. The                            polyps were 5 to 8 mm in size. These polyps were                            removed with a cold snare. Resection and retrieval                            were complete.  Internal hemorrhoids were found during                            retroflexion. The hemorrhoids were small and Grade                            I (internal hemorrhoids that do not prolapse).                           The exam was otherwise without abnormality on                            direct and retroflexion views. Complications:            No immediate complications. Estimated blood loss:                            None. Estimated Blood Loss:     Estimated blood loss: none. Impression:               - Five 5 to 8 mm polyps in the sigmoid colon, in                            the transverse colon and in the ascending colon,                            removed with a cold snare. Resected and retrieved.                           - Internal hemorrhoids.                           - The examination was otherwise normal on direct                            and retroflexion views. Recommendation:            - Repeat colonoscopy after studies are complete for                            surveillance based on pathology results.                           - Patient has a contact number available for                            emergencies. The signs and symptoms of potential                            delayed complications were discussed with the                            patient. Return to normal activities tomorrow.                            Written discharge instructions  were provided to the                            patient.                           - Resume previous diet.                           - Continue present medications.                           - Await pathology results. Meryl Dare, MD 07/25/2023 8:20:20 AM This report has been signed electronically.

## 2023-07-25 NOTE — Progress Notes (Signed)
Called to room to assist during endoscopic procedure.  Patient ID and intended procedure confirmed with present staff. Received instructions for my participation in the procedure from the performing physician.  

## 2023-07-25 NOTE — Progress Notes (Signed)
Pt's states no medical or surgical changes since previsit or office visit. 

## 2023-07-25 NOTE — Patient Instructions (Signed)
    Handouts on polyps & hemorrhoids given to you today.   Await pathology results on polyps removed    Continue previous diet & medications   YOU HAD AN ENDOSCOPIC PROCEDURE TODAY AT THE [facility]:   Refer to the procedure report that was given to you for any specific questions about what was found during the examination.  If the procedure report does not answer your questions, please call your gastroenterologist to clarify.  If you requested that your care partner not be given the details of your procedure findings, then the procedure report has been included in a sealed envelope for you to review at your convenience later.  YOU SHOULD EXPECT: Some feelings of bloating in the abdomen. Passage of more gas than usual.  Walking can help get rid of the air that was put into your GI tract during the procedure and reduce the bloating. If you had a lower endoscopy (such as a colonoscopy or flexible sigmoidoscopy) you may notice spotting of blood in your stool or on the toilet paper. If you underwent a bowel prep for your procedure, you may not have a normal bowel movement for a few days.  Please Note:  You might notice some irritation and congestion in your nose or some drainage.  This is from the oxygen used during your procedure.  There is no need for concern and it should clear up in a day or so.  SYMPTOMS TO REPORT IMMEDIATELY:  Following lower endoscopy (colonoscopy or flexible sigmoidoscopy):  Excessive amounts of blood in the stool  Significant tenderness or worsening of abdominal pains  Swelling of the abdomen that is new, acute  Fever of 100F or higher    For urgent or emergent issues, a gastroenterologist can be reached at any hour by calling [phone]. Do not use MyChart messaging for urgent concerns.    DIET:  We do recommend a small meal at first, but then you may proceed to your regular diet.  Drink plenty of fluids but you should avoid alcoholic beverages  for 24 hours.  ACTIVITY:  You should plan to take it easy for the rest of today and you should NOT DRIVE or use heavy machinery until tomorrow (because of the sedation medicines used during the test).    FOLLOW UP: Our staff will call the number listed on your records the next business day following your procedure.  We will call around 7:15- 8:00 am to check on you and address any questions or concerns that you may have regarding the information given to you following your procedure. If we do not reach you, we will leave a message.     If any biopsies were taken you will be contacted by phone or by letter within the next 1-3 weeks.  Please call us at [phone] if you have not heard about the biopsies in 3 weeks.    SIGNATURES/CONFIDENTIALITY: You and/or your care partner have signed paperwork which will be entered into your electronic medical record.  These signatures attest to the fact that that the information above on your After Visit Summary has been reviewed and is understood.  Full responsibility of the confidentiality of this discharge information lies with you and/or your care-partner.

## 2023-07-28 ENCOUNTER — Telehealth: Payer: Self-pay | Admitting: *Deleted

## 2023-07-28 NOTE — Telephone Encounter (Signed)
  Follow up Call-     07/25/2023    7:07 AM  Call back number  Post procedure Call Back phone  # [phone]  Permission to leave phone message Yes     Patient questions:  Do you have a fever, pain , or abdominal swelling? No. Pain Score  0 *  Have you tolerated food without any problems? Yes.    Have you been able to return to your normal activities? Yes.    Do you have any questions about your discharge instructions: Diet   No. Medications  No. Follow up visit  No.  Do you have questions or concerns about your Care? No.  Actions: * If pain score is 4 or above: No action needed, pain <4.

## 2023-07-29 ENCOUNTER — Encounter: Payer: Self-pay | Admitting: Gastroenterology

## 2023-07-29 LAB — SURGICAL PATHOLOGY
